# Patient Record
Sex: Female | Born: 1965
Health system: Southern US, Community
[De-identification: ages and names within clinical notes are randomized; demographics above are authoritative.]

## PROBLEM LIST (undated history)

## (undated) DIAGNOSIS — R202 Paresthesia of skin: Secondary | ICD-10-CM

## (undated) HISTORY — DX: Paresthesia of skin: R20.2

## (undated) HISTORY — PX: CARPAL TUNNEL WITH CUBITAL TUNNEL: SHX5608

---

## 1998-02-24 ENCOUNTER — Other Ambulatory Visit: Admission: RE | Admit: 1998-02-24 | Discharge: 1998-02-24 | Payer: Self-pay | Admitting: Internal Medicine

## 1999-09-15 ENCOUNTER — Inpatient Hospital Stay (HOSPITAL_COMMUNITY): Admission: AD | Admit: 1999-09-15 | Discharge: 1999-09-17 | Payer: Self-pay | Admitting: Obstetrics & Gynecology

## 1999-09-30 ENCOUNTER — Encounter: Admission: RE | Admit: 1999-09-30 | Discharge: 1999-12-29 | Payer: Self-pay | Admitting: Obstetrics & Gynecology

## 1999-10-28 ENCOUNTER — Other Ambulatory Visit: Admission: RE | Admit: 1999-10-28 | Discharge: 1999-10-28 | Payer: Self-pay | Admitting: Obstetrics & Gynecology

## 2000-12-08 ENCOUNTER — Other Ambulatory Visit: Admission: RE | Admit: 2000-12-08 | Discharge: 2000-12-08 | Payer: Self-pay | Admitting: Obstetrics & Gynecology

## 2001-04-06 ENCOUNTER — Encounter: Payer: Self-pay | Admitting: Obstetrics & Gynecology

## 2001-04-06 ENCOUNTER — Encounter: Admission: RE | Admit: 2001-04-06 | Discharge: 2001-04-06 | Payer: Self-pay | Admitting: Obstetrics & Gynecology

## 2002-01-18 ENCOUNTER — Other Ambulatory Visit: Admission: RE | Admit: 2002-01-18 | Discharge: 2002-01-18 | Payer: Self-pay | Admitting: Obstetrics & Gynecology

## 2002-05-08 ENCOUNTER — Other Ambulatory Visit: Admission: RE | Admit: 2002-05-08 | Discharge: 2002-05-08 | Payer: Self-pay | Admitting: Obstetrics & Gynecology

## 2003-02-27 ENCOUNTER — Other Ambulatory Visit: Admission: RE | Admit: 2003-02-27 | Discharge: 2003-02-27 | Payer: Self-pay | Admitting: Obstetrics & Gynecology

## 2003-05-08 ENCOUNTER — Encounter: Admission: RE | Admit: 2003-05-08 | Discharge: 2003-05-08 | Payer: Self-pay | Admitting: Obstetrics & Gynecology

## 2003-05-10 ENCOUNTER — Encounter: Admission: RE | Admit: 2003-05-10 | Discharge: 2003-05-10 | Payer: Self-pay | Admitting: Obstetrics & Gynecology

## 2004-07-13 ENCOUNTER — Encounter: Admission: RE | Admit: 2004-07-13 | Discharge: 2004-07-13 | Payer: Self-pay | Admitting: Obstetrics & Gynecology

## 2004-07-28 ENCOUNTER — Encounter: Admission: RE | Admit: 2004-07-28 | Discharge: 2004-07-28 | Payer: Self-pay | Admitting: Obstetrics & Gynecology

## 2004-09-29 ENCOUNTER — Ambulatory Visit: Payer: Self-pay | Admitting: Licensed Clinical Social Worker

## 2005-11-11 ENCOUNTER — Inpatient Hospital Stay (HOSPITAL_COMMUNITY): Admission: RE | Admit: 2005-11-11 | Discharge: 2005-11-13 | Payer: Self-pay | Admitting: Obstetrics & Gynecology

## 2006-07-25 ENCOUNTER — Encounter: Admission: RE | Admit: 2006-07-25 | Discharge: 2006-07-25 | Payer: Self-pay | Admitting: Obstetrics & Gynecology

## 2006-08-03 ENCOUNTER — Encounter: Admission: RE | Admit: 2006-08-03 | Discharge: 2006-08-03 | Payer: Self-pay | Admitting: Obstetrics & Gynecology

## 2007-11-18 ENCOUNTER — Emergency Department (HOSPITAL_COMMUNITY): Admission: EM | Admit: 2007-11-18 | Discharge: 2007-11-18 | Payer: Self-pay | Admitting: Emergency Medicine

## 2008-10-29 ENCOUNTER — Encounter: Admission: RE | Admit: 2008-10-29 | Discharge: 2008-10-29 | Payer: Self-pay | Admitting: Obstetrics & Gynecology

## 2009-04-25 ENCOUNTER — Encounter: Admission: RE | Admit: 2009-04-25 | Discharge: 2009-04-25 | Payer: Self-pay | Admitting: Gastroenterology

## 2009-05-30 ENCOUNTER — Ambulatory Visit (HOSPITAL_COMMUNITY): Admission: RE | Admit: 2009-05-30 | Discharge: 2009-05-30 | Payer: Self-pay | Admitting: Urology

## 2009-07-24 ENCOUNTER — Encounter (INDEPENDENT_AMBULATORY_CARE_PROVIDER_SITE_OTHER): Payer: Self-pay | Admitting: Urology

## 2009-07-24 ENCOUNTER — Inpatient Hospital Stay (HOSPITAL_COMMUNITY): Admission: RE | Admit: 2009-07-24 | Discharge: 2009-07-25 | Payer: Self-pay | Admitting: Urology

## 2009-10-21 ENCOUNTER — Ambulatory Visit (HOSPITAL_COMMUNITY): Admission: RE | Admit: 2009-10-21 | Discharge: 2009-10-21 | Payer: Self-pay | Admitting: Urology

## 2009-10-31 ENCOUNTER — Encounter: Admission: RE | Admit: 2009-10-31 | Discharge: 2009-10-31 | Payer: Self-pay | Admitting: Obstetrics & Gynecology

## 2009-11-20 ENCOUNTER — Ambulatory Visit (HOSPITAL_COMMUNITY): Admission: RE | Admit: 2009-11-20 | Discharge: 2009-11-20 | Payer: Self-pay | Admitting: Urology

## 2010-04-19 LAB — BASIC METABOLIC PANEL
BUN: 6 mg/dL (ref 6–23)
BUN: 8 mg/dL (ref 6–23)
CO2: 24 mEq/L (ref 19–32)
Calcium: 8.4 mg/dL (ref 8.4–10.5)
Chloride: 106 mEq/L (ref 96–112)
Creatinine, Ser: 0.73 mg/dL (ref 0.4–1.2)
GFR calc Af Amer: >60 mL/min (ref >60)
GFR calc Af Amer: >60 mL/min (ref >60)
GFR calc non Af Amer: >60 mL/min (ref >60)
Glucose, Bld: 125 mg/dL — ABNORMAL HIGH (ref 70–99)
Glucose, Bld: 149 mg/dL — ABNORMAL HIGH (ref 70–99)
Potassium: 3.4 mEq/L — ABNORMAL LOW (ref 3.5–5.1)
Potassium: 4 mEq/L (ref 3.5–5.1)
Sodium: 136 mEq/L (ref 135–145)
Sodium: 139 mEq/L (ref 135–145)

## 2010-04-19 LAB — TYPE AND SCREEN: ABO/RH(D): O NEG

## 2010-04-19 LAB — ABO/RH: ABO/RH(D): O NEG

## 2010-04-19 LAB — HEMOGLOBIN AND HEMATOCRIT, BLOOD
Hemoglobin: 11.8 g/dL — ABNORMAL LOW (ref 12.0–15.0)
Hemoglobin: 12.9 g/dL (ref 12.0–15.0)

## 2010-04-19 LAB — CREATININE, FLUID (PLEURAL, PERITONEAL, JP DRAINAGE)

## 2010-04-20 LAB — SURGICAL PCR SCREEN: MRSA, PCR: NEGATIVE

## 2010-04-20 LAB — CBC
MCHC: 35.2 g/dL (ref 30.0–36.0)
RDW: 11.7 % (ref 11.5–15.5)

## 2010-04-20 LAB — BASIC METABOLIC PANEL
BUN: 15 mg/dL (ref 6–23)
GFR calc Af Amer: >60 mL/min (ref >60)
Glucose, Bld: 86 mg/dL (ref 70–99)

## 2010-04-20 LAB — PREGNANCY, URINE: Preg Test, Ur: NEGATIVE

## 2010-06-19 NOTE — H&P (Signed)
[facility]  Patient:    MARQUASIA, SCHMIEDER                           MRN: [id number] Adm. Date:  09/15/99 Attending:  Genia Del, M.D.                         History and Physical  DATE OF BIRTH:                [birth year]  PATIENT IDENTIFICATION:       Mrs. Lunt is a 45 year old, G1, last menstrual period November 31, 2000, for expected date of delivery on September 08, 1999, at [redacted] weeks gestation.  REASON FOR ADMISSION:         Induction for post dates.  HISTORY OF PRESENT ILLNESS:   Good fetal movement, no vaginal bleeding, no fluid leak, no uterine contractions, no PIH symptoms.  The patient had an ultrasound on September 10, 1999, showing an estimated fetal weight at 3298 g for the 21st percentile and amniotic fluid index at 13.6 cm for the 64th percentile.  Placenta was posterior, normal, vertex presentation.  Biophysical profile was 8/8.  PAST MEDICAL HISTORY:         Dermatitis followed by dermatologist.  PAST SURGICAL HISTORY:        Negative.  MEDICATIONS:                  1. Clobetasol propionate ointment p.r.n.                               2. Fluocinonide topical cream p.r.n., not used                                  during pregnancy.                               3. Prenatal vitamins.  ALLERGIES:                    PENICILLIN and CODEINE.  SOCIAL HISTORY:               Nonsmoker, married.  Engineer, agricultural.  HISTORY OF PRESENT PREGNANCY: First trimester normal.  Hemoglobin 13.8, platelets 315, blood type Rh O negative, Rh antibodies negative, RPR nonreactive, rubella titer immune, HBsAg negative, HIV nonreactive.  Gonorrhea and chlamydia negative.  Pap test within normal limits January 2001. The patient declined amniocentesis and triple test in spite of advanced maternal age.  She had ultrasound review of anatomy at 21 weeks which was within normal limits except for low-lying placenta.  It was then followed by ultrasound  and, at 29 weeks and 2 days, placenta was inserted normally.  She had an estimated fetal weight at the 17th percentile at that time, so ultrasound for interval growth was done at 31 weeks and 2 days and showed good interval growth at the 41st percentile with normal amniotic fluid index.  Then, another ultrasound was done because the patient was reaching term at 40 weeks and 2 days.  See History of Present Illness.  In the third trimester, one-hour GTT was done at 28 weeks which was abnormal.  RhoGAM was done at that same visit.  Then three-hour GTT was done at 29+ weeks and was within normal limits.  Group B strep was done at 35 weeks and was negative.  REVIEW OF SYSTEMS:            Constitutional negative.  HEENT: Negative. Respiratory: Negative.  Cardiovascular: Negative.  GI: Negative.  Urologic: Negative.  Dermatologic: Occasional symptoms associated with dermatitis, rash, well controlled when using occasional low-dose corticosteroid creams. Neurologic: Negative.  PHYSICAL EXAMINATION:  GENERAL:                      No apparent distress.  VITAL SIGNS:                  Blood pressure 122/80, pulse regular, respiratory rate normal.  No fever.  LUNGS:                        Clear bilaterally.  HEART:                        S1, S2 normal.  No murmur.  ABDOMEN:                      Gravid with a uterine height of 38 cm, nontender, soft, vertex presentation.  PELVIC:                       On vaginal exam, cervix is 1 cm dilated, about 30 to 40% effaced, vertex -2.  EXTREMITIES:                  Lower limbs are normal with very mild edema, no clonus.  LABORATORY DATA:              Fetal heart tone was 152 per minute.  No uterine contraction palpated.  IMPRESSION:                   1. At [redacted] weeks gestation.                               2. Advanced maternal age.                               3. Declined amniocentesis and triple test.                               4. Group B  strep negative.  PLAN:                         Admit to [facility] Labor [facility] Delivery. Induction for post dates with rupture of membranes if possible, the Pitocin high-dose protocol.  Induction risks, benefits, and procedure discussed with patient and husband who agreed with decision. DD:  09/14/99 TD:  09/14/99 Job: [id number]

## 2010-07-13 ENCOUNTER — Other Ambulatory Visit (HOSPITAL_COMMUNITY): Payer: Self-pay | Admitting: Urology

## 2010-07-13 DIAGNOSIS — N135 Crossing vessel and stricture of ureter without hydronephrosis: Secondary | ICD-10-CM

## 2010-07-20 ENCOUNTER — Other Ambulatory Visit (HOSPITAL_COMMUNITY): Payer: Self-pay

## 2010-07-30 ENCOUNTER — Other Ambulatory Visit (HOSPITAL_COMMUNITY): Payer: Self-pay | Admitting: Urology

## 2010-07-30 DIAGNOSIS — N135 Crossing vessel and stricture of ureter without hydronephrosis: Secondary | ICD-10-CM

## 2010-08-19 ENCOUNTER — Encounter (HOSPITAL_COMMUNITY)
Admission: RE | Admit: 2010-08-19 | Discharge: 2010-08-19 | Disposition: A | Discharge status: Home / Self Care | Payer: BC Managed Care – PPO | Source: Ambulatory Visit | Attending: Urology | Admitting: Urology

## 2010-08-19 DIAGNOSIS — N135 Crossing vessel and stricture of ureter without hydronephrosis: Secondary | ICD-10-CM

## 2010-08-19 MED ORDER — TECHNETIUM TC 99M MERTIATIDE
15.0000 | Freq: Once | INTRAVENOUS | Status: AC | PRN
  Administered 2010-08-19: 15 via INTRAVENOUS

## 2010-08-19 MED ORDER — FUROSEMIDE 10 MG/ML IJ SOLN: 30.0000 mg | Freq: Once | INTRAMUSCULAR | Status: DC

## 2010-10-02 ENCOUNTER — Other Ambulatory Visit: Payer: Self-pay | Admitting: Obstetrics & Gynecology

## 2010-10-02 DIAGNOSIS — Z1231 Encounter for screening mammogram for malignant neoplasm of breast: Secondary | ICD-10-CM

## 2010-11-04 ENCOUNTER — Ambulatory Visit
Admission: RE | Admit: 2010-11-04 | Discharge: 2010-11-04 | Disposition: A | Discharge status: Home / Self Care | Payer: BC Managed Care – PPO | Source: Ambulatory Visit | Attending: Obstetrics & Gynecology | Admitting: Obstetrics & Gynecology

## 2010-11-04 DIAGNOSIS — Z1231 Encounter for screening mammogram for malignant neoplasm of breast: Secondary | ICD-10-CM

## 2010-11-09 ENCOUNTER — Other Ambulatory Visit: Payer: Self-pay | Admitting: Obstetrics & Gynecology

## 2010-11-09 DIAGNOSIS — R928 Other abnormal and inconclusive findings on diagnostic imaging of breast: Secondary | ICD-10-CM

## 2010-11-23 ENCOUNTER — Other Ambulatory Visit: Payer: BC Managed Care – PPO

## 2010-11-25 ENCOUNTER — Other Ambulatory Visit: Payer: BC Managed Care – PPO

## 2011-05-18 ENCOUNTER — Ambulatory Visit
Admission: RE | Admit: 2011-05-18 | Discharge: 2011-05-18 | Disposition: A | Discharge status: Home / Self Care | Payer: BC Managed Care – PPO | Source: Ambulatory Visit | Attending: Obstetrics & Gynecology | Admitting: Obstetrics & Gynecology

## 2011-05-18 DIAGNOSIS — R928 Other abnormal and inconclusive findings on diagnostic imaging of breast: Secondary | ICD-10-CM

## 2012-05-04 ENCOUNTER — Other Ambulatory Visit: Payer: Self-pay

## 2012-05-04 DIAGNOSIS — Z1231 Encounter for screening mammogram for malignant neoplasm of breast: Secondary | ICD-10-CM

## 2012-06-09 ENCOUNTER — Ambulatory Visit
Admission: RE | Admit: 2012-06-09 | Discharge: 2012-06-09 | Disposition: A | Discharge status: Home / Self Care | Payer: BC Managed Care – PPO | Source: Ambulatory Visit

## 2012-06-09 DIAGNOSIS — Z1231 Encounter for screening mammogram for malignant neoplasm of breast: Secondary | ICD-10-CM

## 2013-03-06 ENCOUNTER — Ambulatory Visit: Payer: Self-pay | Admitting: Podiatry

## 2013-05-08 ENCOUNTER — Other Ambulatory Visit: Payer: Self-pay

## 2013-05-08 DIAGNOSIS — Z1231 Encounter for screening mammogram for malignant neoplasm of breast: Secondary | ICD-10-CM

## 2013-06-12 ENCOUNTER — Ambulatory Visit: Payer: BC Managed Care – PPO

## 2013-06-22 ENCOUNTER — Ambulatory Visit
Admission: RE | Admit: 2013-06-22 | Discharge: 2013-06-22 | Disposition: A | Discharge status: Home / Self Care | Payer: BC Managed Care – PPO | Source: Ambulatory Visit

## 2013-06-22 DIAGNOSIS — Z1231 Encounter for screening mammogram for malignant neoplasm of breast: Secondary | ICD-10-CM

## 2014-06-19 ENCOUNTER — Other Ambulatory Visit: Payer: Self-pay

## 2014-06-19 DIAGNOSIS — Z1231 Encounter for screening mammogram for malignant neoplasm of breast: Secondary | ICD-10-CM

## 2014-07-16 ENCOUNTER — Ambulatory Visit
Admission: RE | Admit: 2014-07-16 | Discharge: 2014-07-16 | Disposition: A | Discharge status: Home / Self Care | Payer: PRIVATE HEALTH INSURANCE | Source: Ambulatory Visit

## 2014-07-16 DIAGNOSIS — Z1231 Encounter for screening mammogram for malignant neoplasm of breast: Secondary | ICD-10-CM

## 2016-09-21 DIAGNOSIS — M722 Plantar fascial fibromatosis: Secondary | ICD-10-CM | POA: Diagnosis not present

## 2016-09-21 DIAGNOSIS — R202 Paresthesia of skin: Secondary | ICD-10-CM | POA: Diagnosis not present

## 2016-09-30 ENCOUNTER — Ambulatory Visit: Payer: Self-pay | Admitting: Podiatry

## 2017-01-19 DIAGNOSIS — H6983 Other specified disorders of Eustachian tube, bilateral: Secondary | ICD-10-CM | POA: Diagnosis not present

## 2017-02-16 ENCOUNTER — Ambulatory Visit (INDEPENDENT_AMBULATORY_CARE_PROVIDER_SITE_OTHER): Payer: BLUE CROSS/BLUE SHIELD

## 2017-02-16 ENCOUNTER — Ambulatory Visit (INDEPENDENT_AMBULATORY_CARE_PROVIDER_SITE_OTHER): Payer: BLUE CROSS/BLUE SHIELD | Admitting: Orthopaedic Surgery

## 2017-02-16 ENCOUNTER — Encounter (INDEPENDENT_AMBULATORY_CARE_PROVIDER_SITE_OTHER): Payer: Self-pay | Admitting: Orthopaedic Surgery

## 2017-02-16 DIAGNOSIS — M25551 Pain in right hip: Secondary | ICD-10-CM

## 2017-02-16 DIAGNOSIS — M722 Plantar fascial fibromatosis: Secondary | ICD-10-CM | POA: Diagnosis not present

## 2017-02-16 DIAGNOSIS — M7061 Trochanteric bursitis, right hip: Secondary | ICD-10-CM | POA: Insufficient documentation

## 2017-02-16 NOTE — Progress Notes (Signed)
Office Visit Note   Patient: Jennifer Frye           Date of Birth: [birth year]           MRN: [id number] Visit Date: 02/16/2017              Requested by: No referring provider defined for this encounter. PCP: Iva Boop, MD   Assessment & Plan: Visit Diagnoses:  1. Pain in right hip   2. Trochanteric bursitis, right hip   3. Bilateral plantar fasciitis     Plan: We talked about the possibility of steroid injections and she is deferring this now with the option of trying outpatient physical therapy to work on any modalities to decrease her pain and improve her function.  Talked about trochanteric bursitis and plantar fasciitis in great length in detail.  All questions and concerns were answered and addressed.  I gave her an outpatient prescription for physical therapy with [facility] orthopedics is where she like to go to consider the laser treatment.  All questions and concerns were answered and addressed like I said before.  We will see her back in a month see how she is doing overall.  If she is not getting much relief by then she will consider steroid injections.  Follow-Up Instructions: Return in about 4 weeks (around 03/16/2017).   Orders:  Orders Placed This Encounter  Procedures  . XR HIP UNILAT W OR W/O PELVIS 1V RIGHT   No orders of the defined types were placed in this encounter.     Procedures: No procedures performed   Clinical Data: No additional findings.   Subjective: No chief complaint on file. The patient is an incredibly pleasant 52 year old female who comes in with bilateral heel pain for 6 months with the left worse than the right as well as right hip pain for 6 months she points to the trochanteric area as a source of her pain she denies any groin pain.  Both these areas are significant stiff when she gets up in the morning first.  She denies any numbness and tingling in her feet and denies any back issues.  She is tried night splints.  She is tried  other changes in shoe wear and nothing has helped.  The first step she takes in the morning is quite debilitating and painful.  She does work in [employer] and is on her feet all day long.  She denies any other active medical problems at all.  She is not a diabetic.  She is work on activity modification as well.  She does not sleep on that right side at night.  Stretching of her right hip causes significant pain.  HPI  Review of Systems She currently denies any headache, chest pain, shortness of breath, fever, chills, nausea, vomiting.  Objective: Vital Signs: There were no vitals taken for this visit.  Physical Exam She is alert and oriented x3 and in no acute distress Ortho Exam Examination of her right hip shows fluid range of motion of the right hip.  Her pain is over the trochanteric area only.  It does not radiate down the IT band but is definitely all around the stroke.  Both feet have normal-appearing arches.  The left hurts more over the heel than the right side but both of them signs and symptoms and clinical exam consistent with plantar fasciitis. Specialty Comments:  No specialty comments available.  Imaging: Xr Hip Unilat W Or W/o Pelvis 1v Right  Result  Date: 02/16/2017 An AP pelvis and lateral right hip show well located hip joints with no significant arthritic findings at all.  There is no cortical irregularities around either hip or trochanteric areas.  There are no acute findings otherwise.    PMFS History: Patient Active Problem List   Diagnosis Date Noted  . Trochanteric bursitis, right hip 02/16/2017  . Bilateral plantar fasciitis 02/16/2017   History reviewed. No pertinent past medical history.  History reviewed. No pertinent family history.  History reviewed. No pertinent surgical history. Social History   Occupational History  . Not on file  Tobacco Use  . Smoking status: Not on file  Substance and Sexual Activity  . Alcohol use: Not on file  . Drug  use: Not on file  . Sexual activity: Not on file

## 2017-02-21 ENCOUNTER — Other Ambulatory Visit: Payer: Self-pay | Admitting: Obstetrics & Gynecology

## 2017-02-21 DIAGNOSIS — Z1231 Encounter for screening mammogram for malignant neoplasm of breast: Secondary | ICD-10-CM

## 2017-02-28 DIAGNOSIS — M722 Plantar fascial fibromatosis: Secondary | ICD-10-CM | POA: Diagnosis not present

## 2017-02-28 DIAGNOSIS — M7061 Trochanteric bursitis, right hip: Secondary | ICD-10-CM | POA: Diagnosis not present

## 2017-03-01 ENCOUNTER — Telehealth (INDEPENDENT_AMBULATORY_CARE_PROVIDER_SITE_OTHER): Payer: Self-pay

## 2017-03-01 ENCOUNTER — Other Ambulatory Visit (INDEPENDENT_AMBULATORY_CARE_PROVIDER_SITE_OTHER): Payer: Self-pay

## 2017-03-01 MED ORDER — DEXTROSE 5 % IV SOLN: 0.4000 mg | INTRAVENOUS | 1 refills | Status: DC

## 2017-03-01 NOTE — Telephone Encounter (Signed)
That sounds fine, I just don't know how to do that

## 2017-03-01 NOTE — Telephone Encounter (Signed)
Called into pharmacy

## 2017-03-01 NOTE — Telephone Encounter (Signed)
Order written for iontophoresis for pt but need rx for dexamethasone called [facility] phar[facility] med compounded. Call with questions.

## 2017-03-01 NOTE — Telephone Encounter (Signed)
Jennifer Frye returning call. Advised that it would be dexamethasone 0.4% solution they normally dispense 30mL. Just FYI. Cb# [phone]

## 2017-03-01 NOTE — Telephone Encounter (Signed)
Please advise 

## 2017-03-02 DIAGNOSIS — M722 Plantar fascial fibromatosis: Secondary | ICD-10-CM | POA: Diagnosis not present

## 2017-03-04 DIAGNOSIS — M722 Plantar fascial fibromatosis: Secondary | ICD-10-CM | POA: Diagnosis not present

## 2017-03-09 DIAGNOSIS — M722 Plantar fascial fibromatosis: Secondary | ICD-10-CM | POA: Diagnosis not present

## 2017-03-10 ENCOUNTER — Ambulatory Visit: Payer: PRIVATE HEALTH INSURANCE

## 2017-03-11 DIAGNOSIS — M722 Plantar fascial fibromatosis: Secondary | ICD-10-CM | POA: Diagnosis not present

## 2017-03-14 DIAGNOSIS — M722 Plantar fascial fibromatosis: Secondary | ICD-10-CM | POA: Diagnosis not present

## 2017-03-16 DIAGNOSIS — M722 Plantar fascial fibromatosis: Secondary | ICD-10-CM | POA: Diagnosis not present

## 2017-03-22 ENCOUNTER — Ambulatory Visit
Admission: RE | Admit: 2017-03-22 | Discharge: 2017-03-22 | Disposition: A | Discharge status: Home / Self Care | Payer: BLUE CROSS/BLUE SHIELD | Source: Ambulatory Visit | Attending: Obstetrics & Gynecology | Admitting: Obstetrics & Gynecology

## 2017-03-22 DIAGNOSIS — M7061 Trochanteric bursitis, right hip: Secondary | ICD-10-CM | POA: Diagnosis not present

## 2017-03-22 DIAGNOSIS — M722 Plantar fascial fibromatosis: Secondary | ICD-10-CM | POA: Diagnosis not present

## 2017-03-22 DIAGNOSIS — Z1231 Encounter for screening mammogram for malignant neoplasm of breast: Secondary | ICD-10-CM | POA: Diagnosis not present

## 2017-03-24 DIAGNOSIS — M722 Plantar fascial fibromatosis: Secondary | ICD-10-CM | POA: Diagnosis not present

## 2017-03-29 DIAGNOSIS — M722 Plantar fascial fibromatosis: Secondary | ICD-10-CM | POA: Diagnosis not present

## 2017-03-30 ENCOUNTER — Ambulatory Visit (INDEPENDENT_AMBULATORY_CARE_PROVIDER_SITE_OTHER): Payer: BLUE CROSS/BLUE SHIELD | Admitting: Orthopaedic Surgery

## 2017-03-31 ENCOUNTER — Encounter: Payer: PRIVATE HEALTH INSURANCE | Admitting: Obstetrics & Gynecology

## 2017-04-20 ENCOUNTER — Ambulatory Visit (INDEPENDENT_AMBULATORY_CARE_PROVIDER_SITE_OTHER): Payer: BLUE CROSS/BLUE SHIELD | Admitting: Obstetrics & Gynecology

## 2017-04-20 ENCOUNTER — Encounter: Payer: Self-pay | Admitting: Obstetrics & Gynecology

## 2017-04-20 VITALS — BP 140/90 | Ht 66.0 in | Wt 158.0 lb

## 2017-04-20 DIAGNOSIS — R200 Anesthesia of skin: Secondary | ICD-10-CM

## 2017-04-20 DIAGNOSIS — Z01411 Encounter for gynecological examination (general) (routine) with abnormal findings: Secondary | ICD-10-CM | POA: Diagnosis not present

## 2017-04-20 DIAGNOSIS — R202 Paresthesia of skin: Secondary | ICD-10-CM | POA: Diagnosis not present

## 2017-04-20 DIAGNOSIS — N904 Leukoplakia of vulva: Secondary | ICD-10-CM

## 2017-04-20 DIAGNOSIS — Z780 Asymptomatic menopausal state: Secondary | ICD-10-CM | POA: Diagnosis not present

## 2017-04-20 MED ORDER — CLOBETASOL PROPIONATE 0.05 % EX OINT: 1.0000 "application " | TOPICAL_OINTMENT | CUTANEOUS | 5 refills | Status: DC

## 2017-04-20 NOTE — Progress Notes (Signed)
Maximino SarinLori J Banet 10-26-1965 [id number]   History:    52 y.o. G2P2L2 Married.  Daughter Duwayne HeckDanielle is now 54 yo.  RP:  Established patient presenting for annual gyn exam   HPI: Menopause, well on no HRT.  No PMB.  No pelvic pain.  No paint with IC, but not frequently sexually active.  C/O itching at anterior vulva which is worsening.  Urine/BMs wnl.  Breasts wnl.  BMI 25.50.  Persistent Plantar Fasciitis in spite of many different treatments attempted.  C/O numbness of forearms and tingling of both feet.    Past medical history,surgical history, family history and social history were all reviewed and documented in the EPIC chart.  Gynecologic History Patient's last menstrual period was 05/16/2012. Contraception: post menopausal status Last Pap: 03/2016. Results were: Negative.  HPV HR neg 12/2014. Last mammogram: 2019. Results were: Negative Bone Density: Never Colonoscopy: 2016.  5 yr schedule because of brother with Colon Ca  Obstetric History OB History  Gravida Para Term Preterm AB Living  2 2       2   SAB TAB Ectopic Multiple Live Births               # Outcome Date GA Lbr Len/2nd Weight Sex Delivery Anes PTL Lv  2 Para           1 Para                ROS: A ROS was performed and pertinent positives and negatives are included in the history.  GENERAL: No fevers or chills. HEENT: No change in vision, no earache, sore throat or sinus congestion. NECK: No pain or stiffness. CARDIOVASCULAR: No chest pain or pressure. No palpitations. PULMONARY: No shortness of breath, cough or wheeze. GASTROINTESTINAL: No abdominal pain, nausea, vomiting or diarrhea, melena or bright red blood per rectum. GENITOURINARY: No urinary frequency, urgency, hesitancy or dysuria. MUSCULOSKELETAL: No joint or muscle pain, no back pain, no recent trauma. DERMATOLOGIC: No rash, no itching, no lesions. ENDOCRINE: No polyuria, polydipsia, no heat or cold intolerance. No recent change in weight. HEMATOLOGICAL: No  anemia or easy bruising or bleeding. NEUROLOGIC: No headache, seizures, numbness, tingling or weakness. PSYCHIATRIC: No depression, no loss of interest in normal activity or change in sleep pattern.     Exam:   BP 140/90   Ht 5\' 6"  (1.676 m)   Wt 158 lb (71.7 kg)   LMP 05/16/2012   BMI 25.50 kg/m   Body mass index is 25.5 kg/m.  General appearance : Well developed well nourished female. No acute distress HEENT: Eyes: no retinal hemorrhage or exudates,  Neck supple, trachea midline, no carotid bruits, no thyroidmegaly Lungs: Clear to auscultation, no rhonchi or wheezes, or rib retractions  Heart: Regular rate and rhythm, no murmurs or gallops Breast:Examined in sitting and supine position were symmetrical in appearance, no palpable masses or tenderness,  no skin retraction, no nipple inversion, no nipple discharge, no skin discoloration, no axillary or supraclavicular lymphadenopathy Abdomen: no palpable masses or tenderness, no rebound or guarding Extremities: no edema or skin discoloration or tenderness  Pelvic: Vulva: White atrophy anterior vulva with small fissure on right side between labia minora and majora             Vagina: No gross lesions or discharge  Cervix: No gross lesions or discharge  Uterus  AV, normal size, shape and consistency, non-tender and mobile  Adnexa  Without masses or tenderness  Anus: Normal   Assessment/Plan:  52 y.o. female for annual exam   1. Encounter for gynecological examination with abnormal finding Gynecologic exam normal except for early Lichen sclerosus of the vulva.  Pap test negative February 2018 and HPV high risk negative November 2016.  Will repeat Pap test next year.  Breast exam normal.  Screening mammogram negative February 2019.  Colonoscopy 2016, will repeat in 2021.  Health labs with family physician.  2. Menopause present Well on no HRT.  No PMB.  Vitamin D supplements, calcium rich nutrition and regular weightbearing physical  activity recommended.  For now patient is limited by plantar fasciitis, but will increase weight lifting activities.  Will do bone density next year.  3. Lichen sclerosus et atrophicus of the vulva Diagnosis, treatment and risks reviewed with patient.  Will treat with Clobetasol 0.05% ointment, a thin layer on affected skin of vulva twice a day for 2 weeks, then twice a week for long term therapy.  Patient will f/u to reevaluate and do a vulvar biopsy if no improvement of symptoms.  Information added to summary.  4. Numbness and tingling of both feet No evidence of Sciatalgia, no oedema.  Referred to Neurology.  5. Numbness and tingling of both upper extremities Probably due to positional nerve compression, but given the multiple sites, decision to refer to Neurology.  Other orders - cholecalciferol (VITAMIN D) 1000 units tablet; Take 1,000 Units by mouth daily. - clobetasol ointment (TEMOVATE) 0.05 %; Apply 1 application topically 2 (two) times a week. Thin application on vulva twice a day for 2 weeks, then twice a week for long term use.  Counseling on above issues and coordination of care more than 50% of 10 minutes.  Genia Del MD, 4:45 PM 04/20/2017

## 2017-04-20 NOTE — Patient Instructions (Signed)
1. Encounter for gynecological examination with abnormal finding Gynecologic exam normal except for early Lichen sclerosus of the vulva.  Pap test negative February 2018 and HPV high risk negative November 2016.  Will repeat Pap test next year.  Breast exam normal.  Screening mammogram negative February 2019.  Colonoscopy 2016, will repeat in 2021.  Health labs with family physician.  2. Menopause present Well on no HRT.  No PMB.  Vitamin D supplements, calcium rich nutrition and regular weightbearing physical activity recommended.  For now patient is limited by plantar fasciitis, but will increase weight lifting activities.  Will do bone density next year.  3. Lichen sclerosus et atrophicus of the vulva Diagnosis, treatment and risks reviewed with patient.  Will treat with Clobetasol 0.05% ointment, a thin layer on affected skin of vulva twice a day for 2 weeks, then twice a week for long term therapy.  Patient will f/u to reevaluate and do a vulvar biopsy if no improvement of symptoms.  Information added to summary.  4. Numbness and tingling of both feet No evidence of Sciatalgia, no oedema.  Referred to Neurology.  5. Numbness and tingling of both upper extremities Probably due to positional nerve compression, but given the multiple sites, decision to refer to Neurology.  Other orders - cholecalciferol (VITAMIN D) 1000 units tablet; Take 1,000 Units by mouth daily. - clobetasol ointment (TEMOVATE) 0.05 %; Apply 1 application topically 2 (two) times a week. Thin application on vulva twice a day for 2 weeks, then twice a week for long term use.  Kem, it was a pleasure seeing you today!   Lichen Sclerosus Lichen sclerosus is a skin problem. It can happen on any part of the body, but it commonly involves the anal or genital areas. It can cause itching and discomfort in these areas. Treatment can help to control symptoms. When the genital area is affected, getting treatment is important because  the condition can cause scarring that may lead to other problems. What are the causes? The cause of this condition is not known. It could be the result of an overactive immune system or a lack of certain hormones. Lichen sclerosus is not an infection or a fungus. It is not passed from one person to another (not contagious). What increases the risk? This condition is more likely to develop in women, usually after menopause. What are the signs or symptoms? Symptoms of this condition include:  Thin, wrinkled, white areas on the skin.  Thickened white areas on the skin.  Red and swollen patches (lesions) on the skin.  Tears or cracks in the skin.  Bruising.  Blood blisters.  Severe itching.  You may also have pain, itching, or burning with urination. Constipation is also common in people with lichen sclerosus. How is this diagnosed? This condition may be diagnosed with a physical exam. In some cases, a tissue sample (biopsy sample) may be removed to be looked at under a microscope. How is this treated? This condition is usually treated with medicated creams or ointments (topical steroids) that are applied over the affected areas. Follow these instructions at home:  Take over-the-counter and prescription medicines only as told by your health care provider.  Use creams or ointments as told by your health care provider.  Do not scratch the affected areas of skin.  Women should keep the vaginal area as clean and dry as possible.  Keep all follow-up visits as told by your health care provider. This is important. Contact a health  care provider if:  You have increasing redness, swelling, or pain in the affected area.  You have fluid, blood, or pus coming from the affected area.  You have new lesions on your skin.  You have a fever.  You have pain during sex. This information is not intended to replace advice given to you by your health care provider. Make sure you discuss any  questions you have with your health care provider. Document Released: 06/10/2010 Document Revised: 06/26/2015 Document Reviewed: 04/15/2014 Elsevier Interactive Patient Education  2018 [place] Maintenance for Postmenopausal Women Menopause is a normal process in which your reproductive ability comes to an end. This process happens gradually over a span of months to years, usually between the ages of 75 and 75. Menopause is complete when you have missed 12 consecutive menstrual periods. It is important to talk with your health care provider about some of the most common conditions that affect postmenopausal women, such as heart disease, cancer, and bone loss (osteoporosis). Adopting a healthy lifestyle and getting preventive care can help to promote your health and wellness. Those actions can also lower your chances of developing some of these common conditions. What should I know about menopause? During menopause, you may experience a number of symptoms, such as:  Moderate-to-severe hot flashes.  Night sweats.  Decrease in sex drive.  Mood swings.  Headaches.  Tiredness.  Irritability.  Memory problems.  Insomnia.  Choosing to treat or not to treat menopausal changes is an individual decision that you make with your health care provider. What should I know about hormone replacement therapy and supplements? Hormone therapy products are effective for treating symptoms that are associated with menopause, such as hot flashes and night sweats. Hormone replacement carries certain risks, especially as you become older. If you are thinking about using estrogen or estrogen with progestin treatments, discuss the benefits and risks with your health care provider. What should I know about heart disease and stroke? Heart disease, heart attack, and stroke become more likely as you age. This may be due, in part, to the hormonal changes that your body experiences during menopause.  These can affect how your body processes dietary fats, triglycerides, and cholesterol. Heart attack and stroke are both medical emergencies. There are many things that you can do to help prevent heart disease and stroke:  Have your blood pressure checked at least every 1-2 years. High blood pressure causes heart disease and increases the risk of stroke.  If you are 48-44 years old, ask your health care provider if you should take aspirin to prevent a heart attack or a stroke.  Do not use any tobacco products, including cigarettes, chewing tobacco, or electronic cigarettes. If you need help quitting, ask your health care provider.  It is important to eat a healthy diet and maintain a healthy weight. ? Be sure to include plenty of vegetables, fruits, low-fat dairy products, and lean protein. ? Avoid eating foods that are high in solid fats, added sugars, or salt (sodium).  Get regular exercise. This is one of the most important things that you can do for your health. ? Try to exercise for at least 150 minutes each week. The type of exercise that you do should increase your heart rate and make you sweat. This is known as moderate-intensity exercise. ? Try to do strengthening exercises at least twice each week. Do these in addition to the moderate-intensity exercise.  Know your numbers.Ask your health care provider to check  your cholesterol and your blood glucose. Continue to have your blood tested as directed by your health care provider.  What should I know about cancer screening? There are several types of cancer. Take the following steps to reduce your risk and to catch any cancer development as early as possible. Breast Cancer  Practice breast self-awareness. ? This means understanding how your breasts normally appear and feel. ? It also means doing regular breast self-exams. Let your health care provider know about any changes, no matter how small.  If you are 75 or older, have a  clinician do a breast exam (clinical breast exam or CBE) every year. Depending on your age, family history, and medical history, it may be recommended that you also have a yearly breast X-ray (mammogram).  If you have a family history of breast cancer, talk with your health care provider about genetic screening.  If you are at high risk for breast cancer, talk with your health care provider about having an MRI and a mammogram every year.  Breast cancer (BRCA) gene test is recommended for women who have family members with BRCA-related cancers. Results of the assessment will determine the need for genetic counseling and BRCA1 and for BRCA2 testing. BRCA-related cancers include these types: ? Breast. This occurs in males or females. ? Ovarian. ? Tubal. This may also be called fallopian tube cancer. ? Cancer of the abdominal or pelvic lining (peritoneal cancer). ? Prostate. ? Pancreatic.  Cervical, Uterine, and Ovarian Cancer Your health care provider may recommend that you be screened regularly for cancer of the pelvic organs. These include your ovaries, uterus, and vagina. This screening involves a pelvic exam, which includes checking for microscopic changes to the surface of your cervix (Pap test).  For women ages 21-65, health care providers may recommend a pelvic exam and a Pap test every three years. For women ages 39-65, they may recommend the Pap test and pelvic exam, combined with testing for human papilloma virus (HPV), every five years. Some types of HPV increase your risk of cervical cancer. Testing for HPV may also be done on women of any age who have unclear Pap test results.  Other health care providers may not recommend any screening for nonpregnant women who are considered low risk for pelvic cancer and have no symptoms. Ask your health care provider if a screening pelvic exam is right for you.  If you have had past treatment for cervical cancer or a condition that could lead to  cancer, you need Pap tests and screening for cancer for at least 20 years after your treatment. If Pap tests have been discontinued for you, your risk factors (such as having a new sexual partner) need to be reassessed to determine if you should start having screenings again. Some women have medical problems that increase the chance of getting cervical cancer. In these cases, your health care provider may recommend that you have screening and Pap tests more often.  If you have a family history of uterine cancer or ovarian cancer, talk with your health care provider about genetic screening.  If you have vaginal bleeding after reaching menopause, tell your health care provider.  There are currently no reliable tests available to screen for ovarian cancer.  Lung Cancer Lung cancer screening is recommended for adults 24-18 years old who are at high risk for lung cancer because of a history of smoking. A yearly low-dose CT scan of the lungs is recommended if you:  Currently smoke.  Have a history of at least 30 pack-years of smoking and you currently smoke or have quit within the past 15 years. A pack-year is smoking an average of one pack of cigarettes per day for one year.  Yearly screening should:  Continue until it has been 15 years since you quit.  Stop if you develop a health problem that would prevent you from having lung cancer treatment.  Colorectal Cancer  This type of cancer can be detected and can often be prevented.  Routine colorectal cancer screening usually begins at age 60 and continues through age 15.  If you have risk factors for colon cancer, your health care provider may recommend that you be screened at an earlier age.  If you have a family history of colorectal cancer, talk with your health care provider about genetic screening.  Your health care provider may also recommend using home test kits to check for hidden blood in your stool.  A small camera at the end of a  tube can be used to examine your colon directly (sigmoidoscopy or colonoscopy). This is done to check for the earliest forms of colorectal cancer.  Direct examination of the colon should be repeated every 5-10 years until age 2. However, if early forms of precancerous polyps or small growths are found or if you have a family history or genetic risk for colorectal cancer, you may need to be screened more often.  Skin Cancer  Check your skin from head to toe regularly.  Monitor any moles. Be sure to tell your health care provider: ? About any new moles or changes in moles, especially if there is a change in a mole's shape or color. ? If you have a mole that is larger than the size of a pencil eraser.  If any of your family members has a history of skin cancer, especially at a young age, talk with your health care provider about genetic screening.  Always use sunscreen. Apply sunscreen liberally and repeatedly throughout the day.  Whenever you are outside, protect yourself by wearing long sleeves, pants, a wide-brimmed hat, and sunglasses.  What should I know about osteoporosis? Osteoporosis is a condition in which bone destruction happens more quickly than new bone creation. After menopause, you may be at an increased risk for osteoporosis. To help prevent osteoporosis or the bone fractures that can happen because of osteoporosis, the following is recommended:  If you are 80-94 years old, get at least 1,000 mg of calcium and at least 600 mg of vitamin D per day.  If you are older than age 47 but younger than age 85, get at least 1,200 mg of calcium and at least 600 mg of vitamin D per day.  If you are older than age 31, get at least 1,200 mg of calcium and at least 800 mg of vitamin D per day.  Smoking and excessive alcohol intake increase the risk of osteoporosis. Eat foods that are rich in calcium and vitamin D, and do weight-bearing exercises several times each week as directed by your  health care provider. What should I know about how menopause affects my mental health? Depression may occur at any age, but it is more common as you become older. Common symptoms of depression include:  Low or sad mood.  Changes in sleep patterns.  Changes in appetite or eating patterns.  Feeling an overall lack of motivation or enjoyment of activities that you previously enjoyed.  Frequent crying spells.  Talk with your  health care provider if you think that you are experiencing depression. What should I know about immunizations? It is important that you get and maintain your immunizations. These include:  Tetanus, diphtheria, and pertussis (Tdap) booster vaccine.  Influenza every year before the flu season begins.  Pneumonia vaccine.  Shingles vaccine.  Your health care provider may also recommend other immunizations. This information is not intended to replace advice given to you by your health care provider. Make sure you discuss any questions you have with your health care provider. Document Released: 03/12/2005 Document Revised: 08/08/2015 Document Reviewed: 10/22/2014 Elsevier Interactive Patient Education  2018 [employer].

## 2017-04-22 ENCOUNTER — Telehealth: Payer: Self-pay | Admitting: *Deleted

## 2017-04-22 DIAGNOSIS — R200 Anesthesia of skin: Secondary | ICD-10-CM

## 2017-04-22 DIAGNOSIS — R202 Paresthesia of skin: Principal | ICD-10-CM

## 2017-04-22 NOTE — Telephone Encounter (Signed)
-----   Message from Genia DelMarie-Lyne Lavoie, MD sent at 04/20/2017  5:09 PM EDT ----- Regarding: Refer to Neurology Numbness of bilateral forearms and tingling of bilateral feet.

## 2017-04-22 NOTE — Telephone Encounter (Signed)
[facility] will [facility] to [facility]. Pt aware of this as well.

## 2017-05-03 NOTE — Telephone Encounter (Signed)
[facility] called and left message for pt to call 

## 2017-05-04 NOTE — Telephone Encounter (Signed)
Pt scheduled on 05/09/17 @ 7:30am with Dr.Yan

## 2017-05-09 ENCOUNTER — Encounter: Payer: Self-pay | Admitting: Neurology

## 2017-05-09 ENCOUNTER — Ambulatory Visit (INDEPENDENT_AMBULATORY_CARE_PROVIDER_SITE_OTHER): Payer: BLUE CROSS/BLUE SHIELD | Admitting: Neurology

## 2017-05-09 VITALS — BP 120/69 | HR 72 | Ht 66.0 in | Wt 152.0 lb

## 2017-05-09 DIAGNOSIS — R202 Paresthesia of skin: Secondary | ICD-10-CM | POA: Diagnosis not present

## 2017-05-09 NOTE — Progress Notes (Signed)
PATIENT: Jennifer SarinLori J Frye DOB: [birth year]  Chief Complaint  Patient presents with  . Tingling    Reports tingling in forearms and feet.  Symptoms present since the end of July 2018.  She has only tried ibuprofen in the past without any relief.  She has had extensive treatment for bilateral plantar fasciitis.  Rozell Searing. OB-GYN    Lavoie, Marie-Lyne, MD - referring provider  . PCP    Via, Caryn BeeKevin, MD     HISTORICAL   Jennifer Frye is a 52 years old female, seen in refer by her primary care physician Dr. Genia DelLavoie, Marie-Lyne, MD, Via, Caryn BeeKevin, for evaluation of paresthesia, initial evaluation was on May 09, 2017.  She was previously healthy, in July 2018, she worked as a TEFL teacherevent planner, walked long distance, typing long period of time, afterwards, she noticed bilateral fifth finger paresthesia, elbow sensitivity, and also developed plantar fasciitis, eventually had mild improvement with physical therapy, but at the same time, she noticed bottom of her feet numbness tingling, which has been persistent since then.  She still has plantar fasciitis pain when bearing weight, otherwise she denies gait abnormality, no low back pain, no neck pain, no bowel bladder incontinence.  REVIEW OF SYSTEMS: Full 14 system review of systems performed and notable only for as above  ALLERGIES: Allergies  Allergen Reactions  . Codeine Hives  . Penicillins Hives    HOME MEDICATIONS: Current Outpatient Medications  Medication Sig Dispense Refill  . cholecalciferol (VITAMIN D) 1000 units tablet Take 1,000 Units by mouth daily.    . clobetasol ointment (TEMOVATE) 0.05 % Apply 1 application topically 2 (two) times a week. Thin application on vulva twice a day for 2 weeks, then twice a week for long term use. 30 g 5   No current facility-administered medications for this visit.     PAST MEDICAL HISTORY: Past Medical History:  Diagnosis Date  . Tingling     PAST SURGICAL HISTORY: History reviewed. No pertinent  surgical history.  FAMILY HISTORY: Family History  Problem Relation Age of Onset  . Hypertension Father   . Colon cancer Father   . Prostate cancer Father   . Dementia Father   . Hypertension Sister   . Healthy Mother   . Breast cancer Neg Hx     SOCIAL HISTORY:  Social History   Socioeconomic History  . Marital status: Married    Spouse name: Not on file  . Number of children: 2  . Years of education: 16  . Highest education level: Bachelor's degree (e.g., BA, AB, BS)  Occupational History  . Occupation: event planner  Social Needs  . Financial resource strain: Not on file  . Food insecurity:    Worry: Not on file    Inability: Not on file  . Transportation needs:    Medical: Not on file    Non-medical: Not on file  Tobacco Use  . Smoking status: Never Smoker  . Smokeless tobacco: Never Used  Substance and Sexual Activity  . Alcohol use: Yes    Comment: occ  . Drug use: Never  . Sexual activity: Yes    Partners: Male    Comment: 1st intercourse- 23, partners- 1, married- 20 yrs   Lifestyle  . Physical activity:    Days per week: Not on file    Minutes per session: Not on file  . Stress: Not on file  Relationships  . Social connections:    Talks on phone: Not on file  Gets together: Not on file    Attends religious service: Not on file    Active member of club or organization: Not on file    Attends meetings of clubs or organizations: Not on file    Relationship status: Not on file  . Intimate partner violence:    Fear of current or ex partner: Not on file    Emotionally abused: Not on file    Physically abused: Not on file    Forced sexual activity: Not on file  Other Topics Concern  . Not on file  Social History Narrative   Lives at home with family.   Right-handed.   Caffeine use: 2 cups per day (tea only)     PHYSICAL EXAM   Vitals:   05/09/17 0727  BP: 120/69  Pulse: 72  Weight: 152 lb (68.9 kg)  Height: 5\' 6"  (1.676 m)    Not  recorded      Body mass index is 24.53 kg/m.  PHYSICAL EXAMNIATION:  Gen: NAD, conversant, well nourised, obese, well groomed                     Cardiovascular: Regular rate rhythm, no peripheral edema, warm, nontender. Eyes: Conjunctivae clear without exudates or hemorrhage Neck: Supple, no carotid bruits. Pulmonary: Clear to auscultation bilaterally   NEUROLOGICAL EXAM:  MENTAL STATUS: Speech:    Speech is normal; fluent and spontaneous with normal comprehension.  Cognition:     Orientation to time, place and person     Normal recent and remote memory     Normal Attention span and concentration     Normal Language, naming, repeating,spontaneous speech     Fund of knowledge   CRANIAL NERVES: CN II: Visual fields are full to confrontation. Fundoscopic exam is normal with sharp discs and no vascular changes. Pupils are round equal and briskly reactive to light. CN III, IV, VI: extraocular movement are normal. No ptosis. CN V: Facial sensation is intact to pinprick in all 3 divisions bilaterally. Corneal responses are intact.  CN VII: Face is symmetric with normal eye closure and smile. CN VIII: Hearing is normal to rubbing fingers CN IX, X: Palate elevates symmetrically. Phonation is normal. CN XI: Head turning and shoulder shrug are intact CN XII: Tongue is midline with normal movements and no atrophy.  MOTOR: There is no pronator drift of out-stretched arms. Muscle bulk and tone are normal. Muscle strength is normal.  REFLEXES: Reflexes are 2+ and symmetric at the biceps, triceps, knees, and ankles. Plantar responses are flexor.  SENSORY: Intact to light touch, pinprick, positional sensation and vibratory sensation are intact in fingers and toes.  COORDINATION: Rapid alternating movements and fine finger movements are intact. There is no dysmetria on finger-to-nose and heel-knee-shin.    GAIT/STANCE: Posture is normal. Gait is steady with normal steps, base, arm  swing, and turning.  Mildly antalgic with heel walking  Romberg is absent.   DIAGNOSTIC DATA (LABS, IMAGING, TESTING) - I reviewed patient records, labs, notes, testing and imaging myself where available.   ASSESSMENT AND PLAN  Jennifer Frye is a 52 y.o. female   Bilateral feet paresthesia, fifth finger paresthesia  Most suggestive of peripheral neuropathy, bilateral ulnar neuropathy, she does has bilateral elbow sensitivity left worse than right  Laboratory evaluation for potential etiology  Have suggested her as needed NSAIDs, try to avoid over flexion of bilateral elbow and resting elbow on hard surface   Levert Feinstein, M.D. Ph.D.  [facility] [address] [place], [place] [address] Ph: [phone] Fax: [phone]  CC: Genia Del, MD, Via, Caryn Bee, MD

## 2017-05-10 ENCOUNTER — Other Ambulatory Visit (INDEPENDENT_AMBULATORY_CARE_PROVIDER_SITE_OTHER): Payer: Self-pay

## 2017-05-10 DIAGNOSIS — R202 Paresthesia of skin: Secondary | ICD-10-CM

## 2017-05-10 DIAGNOSIS — Z0289 Encounter for other administrative examinations: Secondary | ICD-10-CM

## 2017-05-12 ENCOUNTER — Telehealth: Payer: Self-pay | Admitting: Neurology

## 2017-05-12 LAB — PROTEIN ELECTROPHORESIS
A/G RATIO SPE: 1.4 (ref 0.7–1.7)
ALBUMIN ELP: 4 g/dL (ref 2.9–4.4)
Alpha 1: 0.2 g/dL (ref 0.0–0.4)
Alpha 2: 0.6 g/dL (ref 0.4–1.0)
Beta: 1.1 g/dL (ref 0.7–1.3)
GLOBULIN, TOTAL: 2.9 g/dL (ref 2.2–3.9)
Gamma Globulin: 0.9 g/dL (ref 0.4–1.8)

## 2017-05-12 LAB — COMPREHENSIVE METABOLIC PANEL
ALBUMIN: 4.8 g/dL (ref 3.5–5.5)
ALK PHOS: 103 IU/L (ref 39–117)
ALT: 16 IU/L (ref 0–32)
AST: 20 IU/L (ref 0–40)
Albumin/Globulin Ratio: 2.3 — ABNORMAL HIGH (ref 1.2–2.2)
BILIRUBIN TOTAL: 1.3 mg/dL — AB (ref 0.0–1.2)
BUN / CREAT RATIO: 20 (ref 9–23)
BUN: 18 mg/dL (ref 6–24)
CHLORIDE: 101 mmol/L (ref 96–106)
CO2: 26 mmol/L (ref 20–29)
Calcium: 9.8 mg/dL (ref 8.7–10.2)
Creatinine, Ser: 0.88 mg/dL (ref 0.57–1.00)
GFR calc Af Amer: 86 mL/min/{1.73_m2} (ref >59)
GFR calc non Af Amer: 75 mL/min/{1.73_m2} (ref >59)
GLOBULIN, TOTAL: 2.1 g/dL (ref 1.5–4.5)
GLUCOSE: 92 mg/dL (ref 65–99)
Potassium: 4.5 mmol/L (ref 3.5–5.2)
SODIUM: 142 mmol/L (ref 134–144)
Total Protein: 6.9 g/dL (ref 6.0–8.5)

## 2017-05-12 LAB — LIPID PANEL
CHOL/HDL RATIO: 3.8 ratio (ref 0.0–4.4)
Cholesterol, Total: 240 mg/dL — ABNORMAL HIGH (ref 100–199)
HDL: 63 mg/dL (ref >39)
LDL Calculated: 160 mg/dL — ABNORMAL HIGH (ref 0–99)
TRIGLYCERIDES: 86 mg/dL (ref 0–149)
VLDL Cholesterol Cal: 17 mg/dL (ref 5–40)

## 2017-05-12 LAB — CBC WITH DIFFERENTIAL/PLATELET
BASOS ABS: 0 10*3/uL (ref 0.0–0.2)
Basos: 0 %
EOS (ABSOLUTE): 0.3 10*3/uL (ref 0.0–0.4)
Eos: 5 %
Hematocrit: 46.2 % (ref 34.0–46.6)
Hemoglobin: 15.4 g/dL (ref 11.1–15.9)
Immature Grans (Abs): 0 10*3/uL (ref 0.0–0.1)
Immature Granulocytes: 0 %
LYMPHS ABS: 1.3 10*3/uL (ref 0.7–3.1)
Lymphs: 23 %
MCH: 29.7 pg (ref 26.6–33.0)
MCHC: 33.3 g/dL (ref 31.5–35.7)
MCV: 89 fL (ref 79–97)
MONOS ABS: 0.4 10*3/uL (ref 0.1–0.9)
Monocytes: 7 %
Neutrophils Absolute: 3.7 10*3/uL (ref 1.4–7.0)
Neutrophils: 65 %
Platelets: 246 10*3/uL (ref 150–379)
RBC: 5.18 x10E6/uL (ref 3.77–5.28)
RDW: 13.3 % (ref 12.3–15.4)
WBC: 5.6 10*3/uL (ref 3.4–10.8)

## 2017-05-12 LAB — C-REACTIVE PROTEIN: CRP: 1.7 mg/L (ref 0.0–4.9)

## 2017-05-12 LAB — HGB A1C W/O EAG: HEMOGLOBIN A1C: 5 % (ref 4.8–5.6)

## 2017-05-12 LAB — RPR: RPR Ser Ql: NONREACTIVE

## 2017-05-12 LAB — VITAMIN B12: Vitamin B-12: 367 pg/mL (ref 232–1245)

## 2017-05-12 LAB — SEDIMENTATION RATE: Sed Rate: 2 mm/hr (ref 0–40)

## 2017-05-12 LAB — TSH: TSH: 1.85 u[IU]/mL (ref 0.450–4.500)

## 2017-05-12 NOTE — Telephone Encounter (Signed)
Please call patient, extensive laboratory evaluation showed elevated LDL 160, cholesterol 240, she should start with diet control, exercise, follow-up with her primary care physician,  Rest of the laboratory evaluation showed no significant abnormality.

## 2017-05-12 NOTE — Telephone Encounter (Signed)
Spoke to patient - she is aware of results.  She would like a copy of her office visit and lab results.  This information has been mailed to her home address.

## 2017-06-07 DIAGNOSIS — G5620 Lesion of ulnar nerve, unspecified upper limb: Secondary | ICD-10-CM | POA: Diagnosis not present

## 2017-06-07 DIAGNOSIS — M79673 Pain in unspecified foot: Secondary | ICD-10-CM | POA: Diagnosis not present

## 2017-09-15 ENCOUNTER — Encounter: Payer: Self-pay | Admitting: *Deleted

## 2017-10-20 ENCOUNTER — Encounter: Payer: Self-pay | Admitting: *Deleted

## 2017-10-20 ENCOUNTER — Encounter: Payer: Self-pay | Admitting: Obstetrics & Gynecology

## 2017-10-20 NOTE — Progress Notes (Signed)
Pt requested the High Risk Medicare questions be declined and then asked again each year instead of being documented. KW CMA

## 2018-01-26 DIAGNOSIS — J209 Acute bronchitis, unspecified: Secondary | ICD-10-CM | POA: Diagnosis not present

## 2018-01-26 DIAGNOSIS — J069 Acute upper respiratory infection, unspecified: Secondary | ICD-10-CM | POA: Diagnosis not present

## 2018-03-02 ENCOUNTER — Other Ambulatory Visit: Payer: Self-pay | Admitting: Obstetrics & Gynecology

## 2018-03-02 DIAGNOSIS — Z1231 Encounter for screening mammogram for malignant neoplasm of breast: Secondary | ICD-10-CM

## 2018-03-21 DIAGNOSIS — J069 Acute upper respiratory infection, unspecified: Secondary | ICD-10-CM | POA: Diagnosis not present

## 2018-04-05 ENCOUNTER — Ambulatory Visit
Admission: RE | Admit: 2018-04-05 | Discharge: 2018-04-05 | Disposition: A | Discharge status: Home / Self Care | Payer: BLUE CROSS/BLUE SHIELD | Source: Ambulatory Visit | Attending: Obstetrics & Gynecology | Admitting: Obstetrics & Gynecology

## 2018-04-05 DIAGNOSIS — Z1231 Encounter for screening mammogram for malignant neoplasm of breast: Secondary | ICD-10-CM | POA: Diagnosis not present

## 2018-05-02 ENCOUNTER — Other Ambulatory Visit: Payer: Self-pay

## 2018-05-04 ENCOUNTER — Encounter: Payer: Self-pay | Admitting: Obstetrics & Gynecology

## 2018-05-04 ENCOUNTER — Other Ambulatory Visit: Payer: Self-pay

## 2018-05-04 ENCOUNTER — Ambulatory Visit (INDEPENDENT_AMBULATORY_CARE_PROVIDER_SITE_OTHER): Payer: BLUE CROSS/BLUE SHIELD | Admitting: Obstetrics & Gynecology

## 2018-05-04 VITALS — BP 120/70 | Ht 66.5 in | Wt 148.0 lb

## 2018-05-04 DIAGNOSIS — Z01419 Encounter for gynecological examination (general) (routine) without abnormal findings: Secondary | ICD-10-CM | POA: Diagnosis not present

## 2018-05-04 DIAGNOSIS — N904 Leukoplakia of vulva: Secondary | ICD-10-CM | POA: Diagnosis not present

## 2018-05-04 DIAGNOSIS — Z780 Asymptomatic menopausal state: Secondary | ICD-10-CM

## 2018-05-04 MED ORDER — CLOBETASOL PROPIONATE 0.05 % EX OINT: 1.0000 "application " | TOPICAL_OINTMENT | CUTANEOUS | 5 refills | Status: DC

## 2018-05-04 NOTE — Progress Notes (Signed)
KHILYN ZAJICEK 1965/12/06 [id number]   History:    53 y.o. G2P2L2 Married.  Daughter Duwayne Heck is 62 yo.  Son doing well.  RP:  Established patient presenting for annual gyn exam   HPI: Menopause, well without hormone replacement therapy.  No postmenopausal bleeding.  No pelvic pain.  No pain with intercourse.  Urine and bowel movements normal.  Breast normal.  Body mass index good at 23.53.  Good fitness and nutrition.  Health labs with family physician.  Past medical history,surgical history, family history and social history were all reviewed and documented in the EPIC chart.  Gynecologic History Patient's last menstrual period was 05/16/2012. Contraception: post menopausal status Last Pap: 03/2016. Results were: Negative Last mammogram: 04/2018. Results were: Negative Bone Density: Never Colonoscopy: 2016.  Will call Gastro to schedule next Colono.  Father with Colon Ca.  Obstetric History OB History  Gravida Para Term Preterm AB Living  2 2       2   SAB TAB Ectopic Multiple Live Births               # Outcome Date GA Lbr Len/2nd Weight Sex Delivery Anes PTL Lv  2 Para           1 Para              ROS: A ROS was performed and pertinent positives and negatives are included in the history.  GENERAL: No fevers or chills. HEENT: No change in vision, no earache, sore throat or sinus congestion. NECK: No pain or stiffness. CARDIOVASCULAR: No chest pain or pressure. No palpitations. PULMONARY: No shortness of breath, cough or wheeze. GASTROINTESTINAL: No abdominal pain, nausea, vomiting or diarrhea, melena or bright red blood per rectum. GENITOURINARY: No urinary frequency, urgency, hesitancy or dysuria. MUSCULOSKELETAL: No joint or muscle pain, no back pain, no recent trauma. DERMATOLOGIC: No rash, no itching, no lesions. ENDOCRINE: No polyuria, polydipsia, no heat or cold intolerance. No recent change in weight. HEMATOLOGICAL: No anemia or easy bruising or bleeding. NEUROLOGIC: No  headache, seizures, numbness, tingling or weakness. PSYCHIATRIC: No depression, no loss of interest in normal activity or change in sleep pattern.     Exam:   BP 120/70   Ht 5' 6.5" (1.689 m)   Wt 148 lb (67.1 kg)   LMP 05/16/2012   BMI 23.53 kg/m   Body mass index is 23.53 kg/m.  General appearance : Well developed well nourished female. No acute distress HEENT: Eyes: no retinal hemorrhage or exudates,  Neck supple, trachea midline, no carotid bruits, no thyroidmegaly Lungs: Clear to auscultation, no rhonchi or wheezes, or rib retractions  Heart: Regular rate and rhythm, no murmurs or gallops Breast:Examined in sitting and supine position were symmetrical in appearance, no palpable masses or tenderness,  no skin retraction, no nipple inversion, no nipple discharge, no skin discoloration, no axillary or supraclavicular lymphadenopathy Abdomen: no palpable masses or tenderness, no rebound or guarding Extremities: no edema or skin discoloration or tenderness  Pelvic: Vulva: Normal             Vagina: No gross lesions or discharge  Cervix: No gross lesions or discharge  Uterus  AV, normal size, shape and consistency, non-tender and mobile  Adnexa  Without masses or tenderness  Anus: Normal   Assessment/Plan:  53 y.o. female for annual exam   1. Well female exam with routine gynecological exam Normal gynecologic exam.  Last Pap test February 2018 was negative, no indication to repeat  this year.  Breast exam normal.  Screening mammogram March 2020 was negative.  Health labs with family physician.  Father with colon cancer, will verify with gastroenterology the timing of her next screening colonoscopy.  2. Postmenopause Well on no hormone replacement therapy.  No postmenopausal bleeding.  Recommend vitamin D supplements with calcium intake a total of 1.5 g/day and regular weightbearing physical activities.  3. Lichen sclerosus et atrophicus of the vulva Lichen sclerosus well  controlled on clobetasol ointment.  Clobetasol ointment represcribed.  Other orders - clobetasol ointment (TEMOVATE) 0.05 %; Apply 1 application topically 2 (two) times a week. Thin application on vulva twice a day for 2 weeks, then twice a week for long term use.  Genia Del MD, 8:26 AM 05/04/2018

## 2018-05-04 NOTE — Patient Instructions (Signed)
1. Well female exam with routine gynecological exam Normal gynecologic exam.  Last Pap test February 2018 was negative, no indication to repeat this year.  Breast exam normal.  Screening mammogram March 2020 was negative.  Health labs with family physician.  Father with colon cancer, will verify with gastroenterology the timing of her next screening colonoscopy.  2. Postmenopause Well on no hormone replacement therapy.  No postmenopausal bleeding.  Recommend vitamin D supplements with calcium intake a total of 1.5 g/day and regular weightbearing physical activities.  3. Lichen sclerosus et atrophicus of the vulva Lichen sclerosus well controlled on clobetasol ointment.  Clobetasol ointment represcribed.  Other orders - clobetasol ointment (TEMOVATE) 0.05 %; Apply 1 application topically 2 (two) times a week. Thin application on vulva twice a day for 2 weeks, then twice a week for long term use.  Jennifer Frye, it was a pleasure seeing you today!

## 2018-05-26 DIAGNOSIS — M722 Plantar fascial fibromatosis: Secondary | ICD-10-CM | POA: Diagnosis not present

## 2018-05-26 DIAGNOSIS — R202 Paresthesia of skin: Secondary | ICD-10-CM | POA: Diagnosis not present

## 2018-08-15 DIAGNOSIS — R202 Paresthesia of skin: Secondary | ICD-10-CM | POA: Diagnosis not present

## 2019-02-27 ENCOUNTER — Other Ambulatory Visit: Payer: Self-pay | Admitting: Obstetrics & Gynecology

## 2019-02-27 DIAGNOSIS — Z1231 Encounter for screening mammogram for malignant neoplasm of breast: Secondary | ICD-10-CM

## 2019-04-09 ENCOUNTER — Other Ambulatory Visit: Payer: Self-pay

## 2019-04-09 ENCOUNTER — Ambulatory Visit
Admission: RE | Admit: 2019-04-09 | Discharge: 2019-04-09 | Disposition: A | Discharge status: Home / Self Care | Payer: 59 | Source: Ambulatory Visit | Attending: Obstetrics & Gynecology | Admitting: Obstetrics & Gynecology

## 2019-04-09 DIAGNOSIS — Z1231 Encounter for screening mammogram for malignant neoplasm of breast: Secondary | ICD-10-CM

## 2019-05-15 ENCOUNTER — Other Ambulatory Visit: Payer: Self-pay

## 2019-05-16 ENCOUNTER — Encounter: Payer: Self-pay | Admitting: Obstetrics & Gynecology

## 2019-05-16 ENCOUNTER — Ambulatory Visit (INDEPENDENT_AMBULATORY_CARE_PROVIDER_SITE_OTHER): Payer: 59 | Admitting: Obstetrics & Gynecology

## 2019-05-16 VITALS — BP 142/90 | Ht 66.0 in | Wt 145.8 lb

## 2019-05-16 DIAGNOSIS — Z01419 Encounter for gynecological examination (general) (routine) without abnormal findings: Secondary | ICD-10-CM

## 2019-05-16 DIAGNOSIS — Z780 Asymptomatic menopausal state: Secondary | ICD-10-CM | POA: Diagnosis not present

## 2019-05-16 NOTE — Addendum Note (Signed)
Addended by: Berna Spare A on: 05/16/2019 11:33 AM   Modules accepted: Orders

## 2019-05-16 NOTE — Progress Notes (Signed)
BRIAH NARY [birth year] [id number]   History:    54 y.o. G2P2L2 Married.  Daughter Duwayne Heck is 37 yo, engaged.  Son doing well, enjoys pottery.  RP:  Established patient presenting for annual gyn exam   HPI: Menopause, well without hormone replacement therapy.  No postmenopausal bleeding.  No pelvic pain.  No pain with intercourse.  Urine and bowel movements normal.  Breast normal.  Body mass index good at 23.53.  Good fitness and nutrition.  Health labs with family physician.  Past medical history,surgical history, family history and social history were all reviewed and documented in the EPIC chart.  Gynecologic History Patient's last menstrual period was 05/16/2012.  Obstetric History OB History  Gravida Para Term Preterm AB Living  2 2       2   SAB TAB Ectopic Multiple Live Births               # Outcome Date GA Lbr Len/2nd Weight Sex Delivery Anes PTL Lv  2 Para           1 Para              ROS: A ROS was performed and pertinent positives and negatives are included in the history.  GENERAL: No fevers or chills. HEENT: No change in vision, no earache, sore throat or sinus congestion. NECK: No pain or stiffness. CARDIOVASCULAR: No chest pain or pressure. No palpitations. PULMONARY: No shortness of breath, cough or wheeze. GASTROINTESTINAL: No abdominal pain, nausea, vomiting or diarrhea, melena or bright red blood per rectum. GENITOURINARY: No urinary frequency, urgency, hesitancy or dysuria. MUSCULOSKELETAL: No joint or muscle pain, no back pain, no recent trauma. DERMATOLOGIC: No rash, no itching, no lesions. ENDOCRINE: No polyuria, polydipsia, no heat or cold intolerance. No recent change in weight. HEMATOLOGICAL: No anemia or easy bruising or bleeding. NEUROLOGIC: No headache, seizures, numbness, tingling or weakness. PSYCHIATRIC: No depression, no loss of interest in normal activity or change in sleep pattern.     Exam:   BP (!) 142/90   Ht 5\' 6"  (1.676 m)   Wt 145  lb 12.8 oz (66.1 kg)   LMP 05/16/2012   BMI 23.53 kg/m   Body mass index is 23.53 kg/m.  General appearance : Well developed well nourished female. No acute distress HEENT: Eyes: no retinal hemorrhage or exudates,  Neck supple, trachea midline, no carotid bruits, no thyroidmegaly Lungs: Clear to auscultation, no rhonchi or wheezes, or rib retractions  Heart: Regular rate and rhythm, no murmurs or gallops Breast:Examined in sitting and supine position were symmetrical in appearance, no palpable masses or tenderness,  no skin retraction, no nipple inversion, no nipple discharge, no skin discoloration, no axillary or supraclavicular lymphadenopathy Abdomen: no palpable masses or tenderness, no rebound or guarding Extremities: no edema or skin discoloration or tenderness  Pelvic: Vulva: Normal             Vagina: No gross lesions or discharge  Cervix: No gross lesions or discharge.  Pap reflex done.  Uterus AV, normal size, shape and consistency, non-tender and mobile  Adnexa  Without masses or tenderness  Anus: Normal   Assessment/Plan:  54 y.o. female for annual exam   1. Encounter for routine gynecological examination with Papanicolaou smear of cervix Normal gynecologic exam.  Pap reflex done.  Breast exam normal.  Screening mammogram March 2021 was negative.  Scheduling colonoscopy this year.  Health labs with family physician.  Very good body mass index at 23.53.  Continue with healthy nutrition and fitness.  2. Postmenopause Well on no hormone replacement therapy.  No postmenopausal bleeding.  Vitamin D supplements, calcium intake of 1200 mg daily and regular weightbearing physical activities.  Princess Bruins MD, 10:10 AM 05/16/2019

## 2019-05-16 NOTE — Patient Instructions (Signed)
1. Encounter for routine gynecological examination with Papanicolaou smear of cervix Normal gynecologic exam.  Pap reflex done.  Breast exam normal.  Screening mammogram March 2021 was negative.  Scheduling colonoscopy this year.  Health labs with family physician.  Very good body mass index at 23.53.  Continue with healthy nutrition and fitness.  2. Postmenopause Well on no hormone replacement therapy.  No postmenopausal bleeding.  Vitamin D supplements, calcium intake of 1200 mg daily and regular weightbearing physical activities.  [place], it was a pleasure seeing you today!  I will inform you of your results as soon as they are available.

## 2019-05-18 LAB — PAP IG W/ RFLX HPV ASCU

## 2019-11-17 ENCOUNTER — Other Ambulatory Visit: Payer: 59

## 2019-11-17 DIAGNOSIS — Z20822 Contact with and (suspected) exposure to covid-19: Secondary | ICD-10-CM

## 2019-11-19 LAB — SARS-COV-2, NAA 2 DAY TAT

## 2019-11-19 LAB — NOVEL CORONAVIRUS, NAA: SARS-CoV-2, NAA: NOT DETECTED

## 2020-03-04 ENCOUNTER — Other Ambulatory Visit: Payer: Self-pay | Admitting: Obstetrics & Gynecology

## 2020-03-04 DIAGNOSIS — Z1231 Encounter for screening mammogram for malignant neoplasm of breast: Secondary | ICD-10-CM

## 2020-04-18 ENCOUNTER — Ambulatory Visit
Admission: RE | Admit: 2020-04-18 | Discharge: 2020-04-18 | Disposition: A | Discharge status: Home / Self Care | Payer: 59 | Source: Ambulatory Visit | Attending: Obstetrics & Gynecology | Admitting: Obstetrics & Gynecology

## 2020-04-18 ENCOUNTER — Other Ambulatory Visit: Payer: Self-pay

## 2020-04-18 DIAGNOSIS — Z1231 Encounter for screening mammogram for malignant neoplasm of breast: Secondary | ICD-10-CM | POA: Diagnosis not present

## 2020-06-13 ENCOUNTER — Other Ambulatory Visit: Payer: Self-pay

## 2020-06-13 ENCOUNTER — Ambulatory Visit (INDEPENDENT_AMBULATORY_CARE_PROVIDER_SITE_OTHER): Payer: BC Managed Care – PPO | Admitting: Obstetrics & Gynecology

## 2020-06-13 ENCOUNTER — Encounter: Payer: Self-pay | Admitting: Obstetrics & Gynecology

## 2020-06-13 VITALS — BP 126/78 | Ht 66.0 in | Wt 150.0 lb

## 2020-06-13 DIAGNOSIS — Z01419 Encounter for gynecological examination (general) (routine) without abnormal findings: Secondary | ICD-10-CM

## 2020-06-13 DIAGNOSIS — Z780 Asymptomatic menopausal state: Secondary | ICD-10-CM

## 2020-06-13 NOTE — Progress Notes (Signed)
Jennifer Frye Jan 28, 1966 [id number]   History:    55 y.o. G2P2L2 Married.  Todd husband had eye cancer. Daughter Duwayne Heck is 38 yo, getting married in 3 weeks. Son 42 yo doing well, enjoys pottery.  WI:[id number] presenting for annual gyn exam   QQI:[id number], well without hormone replacement therapy. No postmenopausal bleeding. No pelvic pain. No pain with intercourse. Urine and bowel movements normal. Breast normal. Body mass index good at 24.21. Good fitness and nutrition. Health labs with family physician.   Past medical history,surgical history, family history and social history were all reviewed and documented in the EPIC chart.  Gynecologic History Patient's last menstrual period was 05/16/2012.  Obstetric History OB History  Gravida Para Term Preterm AB Living  2 2       2   SAB IAB Ectopic Multiple Live Births               # Outcome Date GA Lbr Len/2nd Weight Sex Delivery Anes PTL Lv  2 Para           1 Para              ROS: A ROS was performed and pertinent positives and negatives are included in the history.  GENERAL: No fevers or chills. HEENT: No change in vision, no earache, sore throat or sinus congestion. NECK: No pain or stiffness. CARDIOVASCULAR: No chest pain or pressure. No palpitations. PULMONARY: No shortness of breath, cough or wheeze. GASTROINTESTINAL: No abdominal pain, nausea, vomiting or diarrhea, melena or bright red blood per rectum. GENITOURINARY: No urinary frequency, urgency, hesitancy or dysuria. MUSCULOSKELETAL: No joint or muscle pain, no back pain, no recent trauma. DERMATOLOGIC: No rash, no itching, no lesions. ENDOCRINE: No polyuria, polydipsia, no heat or cold intolerance. No recent change in weight. HEMATOLOGICAL: No anemia or easy bruising or bleeding. NEUROLOGIC: No headache, seizures, numbness, tingling or weakness. PSYCHIATRIC: No depression, no loss of interest in normal activity or change in sleep pattern.      Exam:   BP 126/78   Ht 5\' 6"  (1.676 m)   Wt 150 lb (68 kg)   LMP 05/16/2012   BMI 24.21 kg/m   Body mass index is 24.21 kg/m.  General appearance : Well developed well nourished female. No acute distress HEENT: Eyes: no retinal hemorrhage or exudates,  Neck supple, trachea midline, no carotid bruits, no thyroidmegaly Lungs: Clear to auscultation, no rhonchi or wheezes, or rib retractions  Heart: Regular rate and rhythm, no murmurs or gallops Breast:Examined in sitting and supine position were symmetrical in appearance, no palpable masses or tenderness,  no skin retraction, no nipple inversion, no nipple discharge, no skin discoloration, no axillary or supraclavicular lymphadenopathy Abdomen: no palpable masses or tenderness, no rebound or guarding Extremities: no edema or skin discoloration or tenderness  Pelvic: Vulva: Normal             Vagina: No gross lesions or discharge  Cervix: No gross lesions or discharge  Uterus  AV, normal size, shape and consistency, non-tender and mobile  Adnexa  Without masses or tenderness  Anus: Normal   Assessment/Plan:  55 y.o. female for annual exam   1. Well female exam with routine gynecological exam Normal gynecologic exam.  Pap test April 2021 was negative, will repeat at 2 to 3 years.  Breast exam normal.  Screening mammogram March 2022 was negative.  Colonoscopy January 2022.  Health labs with family physician.  Good body mass index at 24.21.  Continue  with fitness and healthy nutrition.  2. Postmenopause Well on no hormone replacement therapy.  No postmenopausal bleeding.  Vitamin D supplements, calcium intake of 1.5 g/day total recommended.  Continue with regular weightbearing physical activities.  Other orders - Ascorbic Acid (VITAMIN C) 100 MG tablet; Take 100 mg by mouth daily. - b complex vitamins capsule; Take 1 capsule by mouth daily.  Genia Del MD, 9:46 AM 06/13/2020

## 2020-07-17 DIAGNOSIS — K469 Unspecified abdominal hernia without obstruction or gangrene: Secondary | ICD-10-CM | POA: Diagnosis not present

## 2020-08-14 DIAGNOSIS — Z13220 Encounter for screening for lipoid disorders: Secondary | ICD-10-CM | POA: Diagnosis not present

## 2020-08-14 DIAGNOSIS — Z0000 Encounter for general adult medical examination without abnormal findings: Secondary | ICD-10-CM | POA: Diagnosis not present

## 2020-08-21 DIAGNOSIS — Z0000 Encounter for general adult medical examination without abnormal findings: Secondary | ICD-10-CM | POA: Diagnosis not present

## 2020-08-29 DIAGNOSIS — H43811 Vitreous degeneration, right eye: Secondary | ICD-10-CM | POA: Diagnosis not present

## 2020-08-29 DIAGNOSIS — H2513 Age-related nuclear cataract, bilateral: Secondary | ICD-10-CM | POA: Diagnosis not present

## 2021-04-23 ENCOUNTER — Encounter: Payer: Self-pay | Admitting: Obstetrics & Gynecology

## 2021-04-23 DIAGNOSIS — Z1231 Encounter for screening mammogram for malignant neoplasm of breast: Secondary | ICD-10-CM | POA: Diagnosis not present

## 2021-06-16 ENCOUNTER — Ambulatory Visit: Payer: BC Managed Care – PPO | Admitting: Obstetrics & Gynecology

## 2021-06-22 ENCOUNTER — Ambulatory Visit (INDEPENDENT_AMBULATORY_CARE_PROVIDER_SITE_OTHER): Payer: BC Managed Care – PPO | Admitting: Obstetrics & Gynecology

## 2021-06-22 ENCOUNTER — Encounter: Payer: Self-pay | Admitting: Obstetrics & Gynecology

## 2021-06-22 VITALS — BP 118/78 | Ht 65.5 in | Wt 155.0 lb

## 2021-06-22 DIAGNOSIS — Z780 Asymptomatic menopausal state: Secondary | ICD-10-CM | POA: Diagnosis not present

## 2021-06-22 DIAGNOSIS — Z01419 Encounter for gynecological examination (general) (routine) without abnormal findings: Secondary | ICD-10-CM

## 2021-06-22 NOTE — Progress Notes (Signed)
Jennifer Frye [birth year] [id number]   History:    56 y.o. G2P2L2 Married.  Todd husband had eye cancer.  Daughter Duwayne Heck is 45 yo, married.  Son 52 yo doing well, enjoys pottery.   RP:  Established patient presenting for annual gyn exam    HPI: Postmenopause, well without hormone replacement therapy.  No postmenopausal bleeding.  No pelvic pain.  No pain with intercourse.  Pap Neg 05/2019.  No h/o abnormal Pap.  Will repeat Pap at 3 years. Urine and bowel movements normal.  Breasts normal. Mammo 04/2021 Neg.  Body mass index good at 25.4.  Good fitness and nutrition.  Health labs with family physician. Colono 10/2019.     Past medical history,surgical history, family history and social history were all reviewed and documented in the EPIC chart.  Gynecologic History Patient's last menstrual period was 05/16/2012.  Obstetric History OB History  Gravida Para Term Preterm AB Living  2 2       2   SAB IAB Ectopic Multiple Live Births               # Outcome Date GA Lbr Len/2nd Weight Sex Delivery Anes PTL Lv  2 Para           1 Para              ROS: A ROS was performed and pertinent positives and negatives are included in the history. GENERAL: No fevers or chills. HEENT: No change in vision, no earache, sore throat or sinus congestion. NECK: No pain or stiffness. CARDIOVASCULAR: No chest pain or pressure. No palpitations. PULMONARY: No shortness of breath, cough or wheeze. GASTROINTESTINAL: No abdominal pain, nausea, vomiting or diarrhea, melena or bright red blood per rectum. GENITOURINARY: No urinary frequency, urgency, hesitancy or dysuria. MUSCULOSKELETAL: No joint or muscle pain, no back pain, no recent trauma. DERMATOLOGIC: No rash, no itching, no lesions. ENDOCRINE: No polyuria, polydipsia, no heat or cold intolerance. No recent change in weight. HEMATOLOGICAL: No anemia or easy bruising or bleeding. NEUROLOGIC: No headache, seizures, numbness, tingling or weakness. PSYCHIATRIC: No  depression, no loss of interest in normal activity or change in sleep pattern.     Exam:   BP 118/78   Ht 5' 5.5" (1.664 m)   Wt 155 lb (70.3 kg)   LMP 05/16/2012   BMI 25.40 kg/m   Body mass index is 25.4 kg/m.  General appearance : Well developed well nourished female. No acute distress HEENT: Eyes: no retinal hemorrhage or exudates,  Neck supple, trachea midline, no carotid bruits, no thyroidmegaly Lungs: Clear to auscultation, no rhonchi or wheezes, or rib retractions  Heart: Regular rate and rhythm, no murmurs or gallops Breast:Examined in sitting and supine position were symmetrical in appearance, no palpable masses or tenderness,  no skin retraction, no nipple inversion, no nipple discharge, no skin discoloration, no axillary or supraclavicular lymphadenopathy Abdomen: no palpable masses or tenderness, no rebound or guarding Extremities: no edema or skin discoloration or tenderness  Pelvic: Vulva: Normal             Vagina: No gross lesions or discharge  Cervix: No gross lesions or discharge  Uterus  AV, normal size, shape and consistency, non-tender and mobile  Adnexa  Without masses or tenderness  Anus: Normal   Assessment/Plan:  56 y.o. female for annual exam   1. Well female exam with routine gynecological exam Postmenopause, well without hormone replacement therapy.  No postmenopausal bleeding.  No pelvic pain.  No pain with intercourse.  Pap Neg 05/2019.  No h/o abnormal Pap.  Will repeat Pap at 3 years. Urine and bowel movements normal.  Breasts normal. Mammo 04/2021 Neg.  Body mass index good at 25.4.  Good fitness and nutrition.  Health labs with family physician. Colono 10/2019.   2. Postmenopause  Postmenopause, well without hormone replacement therapy.  No postmenopausal bleeding.  No pelvic pain.  No pain with intercourse.    Genia Del MD, 8:41 AM 06/22/2021

## 2021-07-06 DIAGNOSIS — J019 Acute sinusitis, unspecified: Secondary | ICD-10-CM | POA: Diagnosis not present

## 2021-07-06 DIAGNOSIS — R059 Cough, unspecified: Secondary | ICD-10-CM | POA: Diagnosis not present

## 2021-08-31 DIAGNOSIS — Z0000 Encounter for general adult medical examination without abnormal findings: Secondary | ICD-10-CM | POA: Diagnosis not present

## 2021-08-31 DIAGNOSIS — Z13220 Encounter for screening for lipoid disorders: Secondary | ICD-10-CM | POA: Diagnosis not present

## 2021-09-07 DIAGNOSIS — Z0000 Encounter for general adult medical examination without abnormal findings: Secondary | ICD-10-CM | POA: Diagnosis not present

## 2021-09-08 ENCOUNTER — Other Ambulatory Visit: Payer: Self-pay | Admitting: Family Medicine

## 2021-09-08 DIAGNOSIS — E7800 Pure hypercholesterolemia, unspecified: Secondary | ICD-10-CM

## 2021-09-22 ENCOUNTER — Other Ambulatory Visit (HOSPITAL_BASED_OUTPATIENT_CLINIC_OR_DEPARTMENT_OTHER): Payer: Self-pay | Admitting: Family Medicine

## 2021-09-22 DIAGNOSIS — E7800 Pure hypercholesterolemia, unspecified: Secondary | ICD-10-CM

## 2021-11-02 ENCOUNTER — Ambulatory Visit (HOSPITAL_BASED_OUTPATIENT_CLINIC_OR_DEPARTMENT_OTHER)
Admission: RE | Admit: 2021-11-02 | Discharge: 2021-11-02 | Disposition: A | Discharge status: Home / Self Care | Payer: Self-pay | Source: Ambulatory Visit | Attending: Family Medicine | Admitting: Family Medicine

## 2021-11-02 DIAGNOSIS — E7800 Pure hypercholesterolemia, unspecified: Secondary | ICD-10-CM | POA: Insufficient documentation

## 2021-12-10 DIAGNOSIS — R059 Cough, unspecified: Secondary | ICD-10-CM | POA: Diagnosis not present

## 2021-12-27 IMAGING — MG DIGITAL SCREENING BILAT W/ TOMO W/ CAD
8 series · 8 of 24 positions shown · non-contrast
Comparison: Previous exam(s).

CLINICAL DATA: Screening.

EXAM:
DIGITAL SCREENING BILATERAL MAMMOGRAM WITH TOMO AND CAD

[L CC synth-2D]
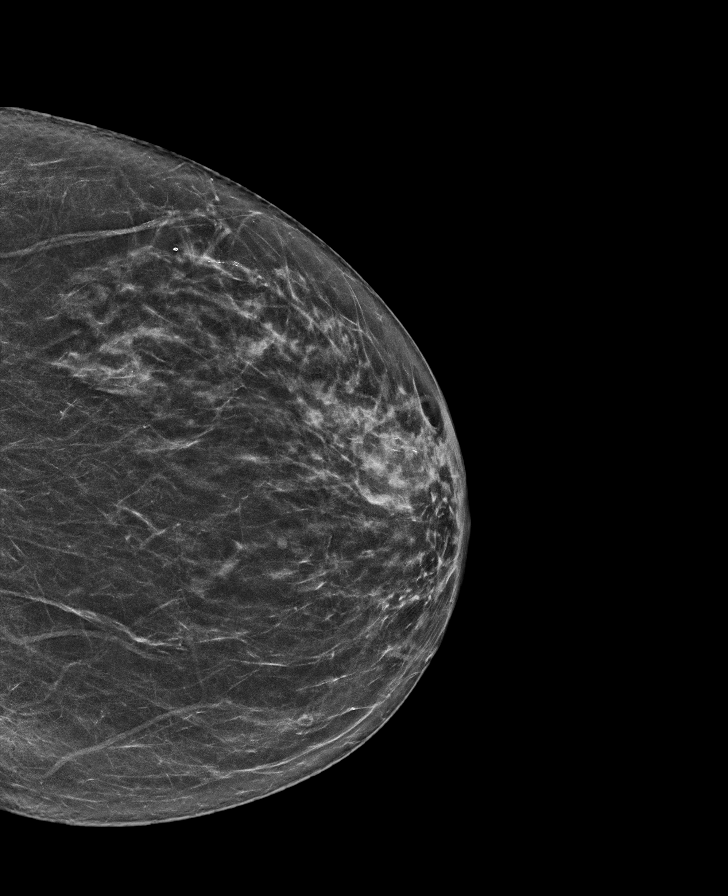

[L MLO synth-2D]
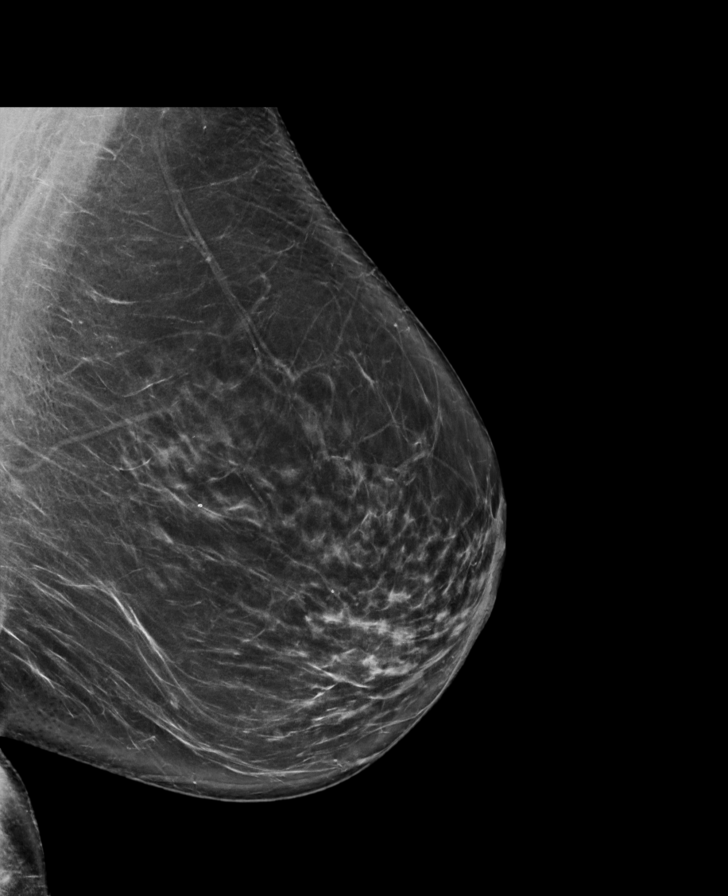

[R MLO synth-2D]
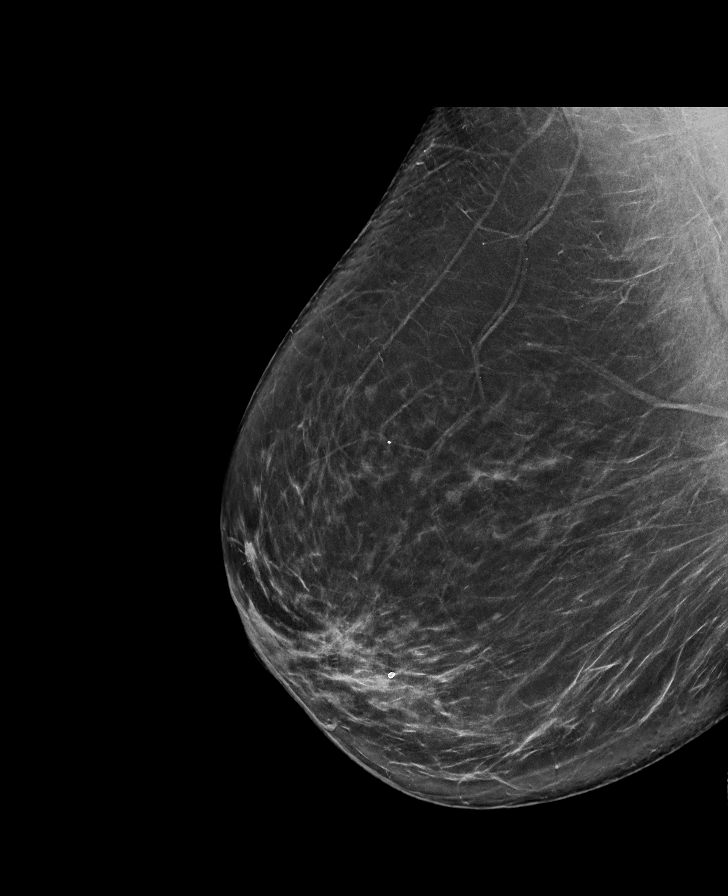

[R CC synth-2D]
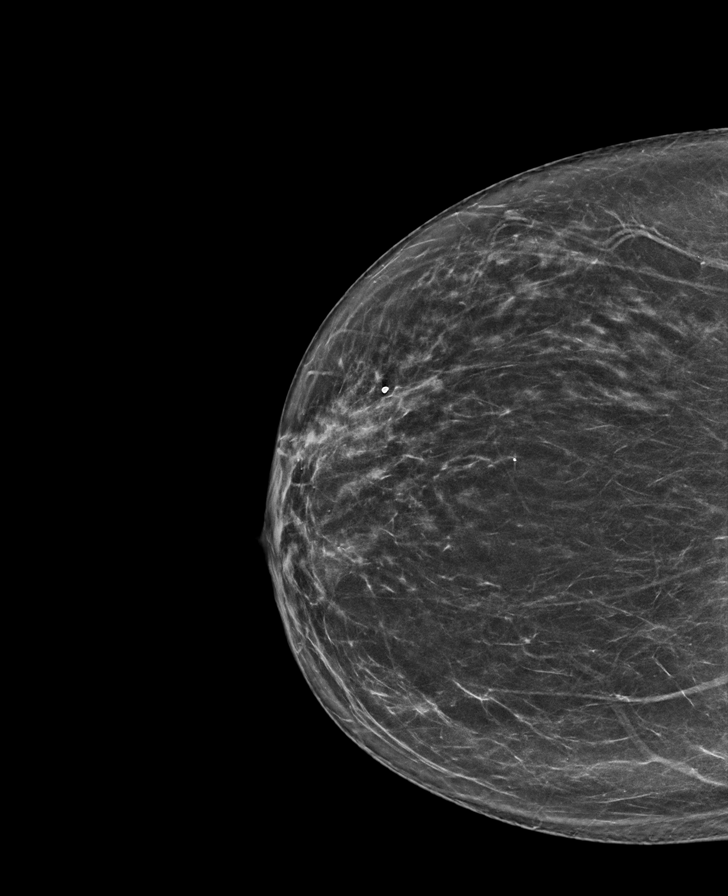

[L MLO tomo · tomo slice 43/85.0]
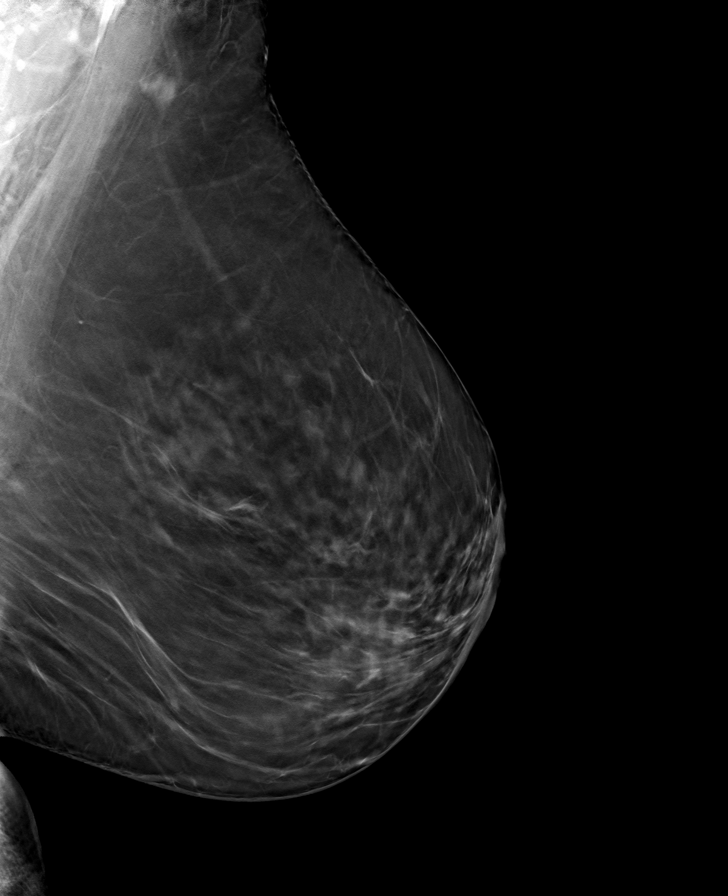

[R CC tomo · tomo slice 35/70.0]
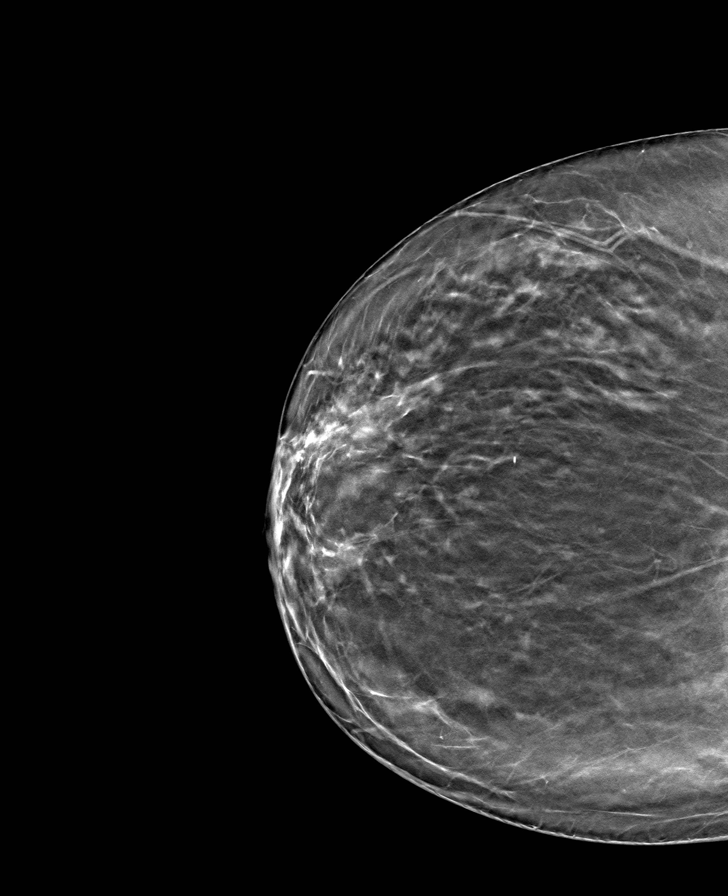

[L CC tomo · tomo slice 35/68.0]
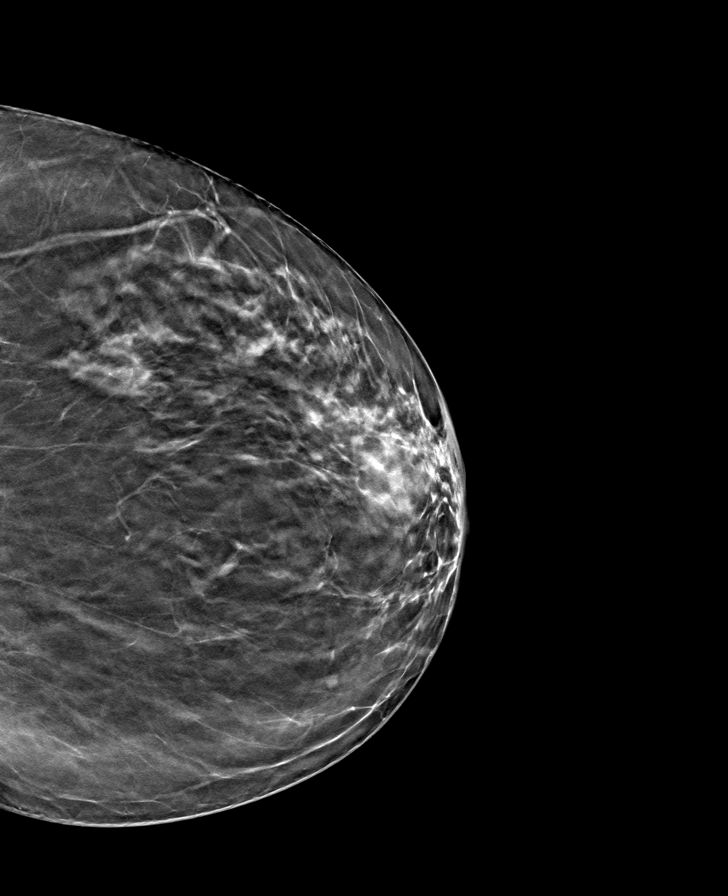

[R MLO tomo · tomo slice 43/85.0]
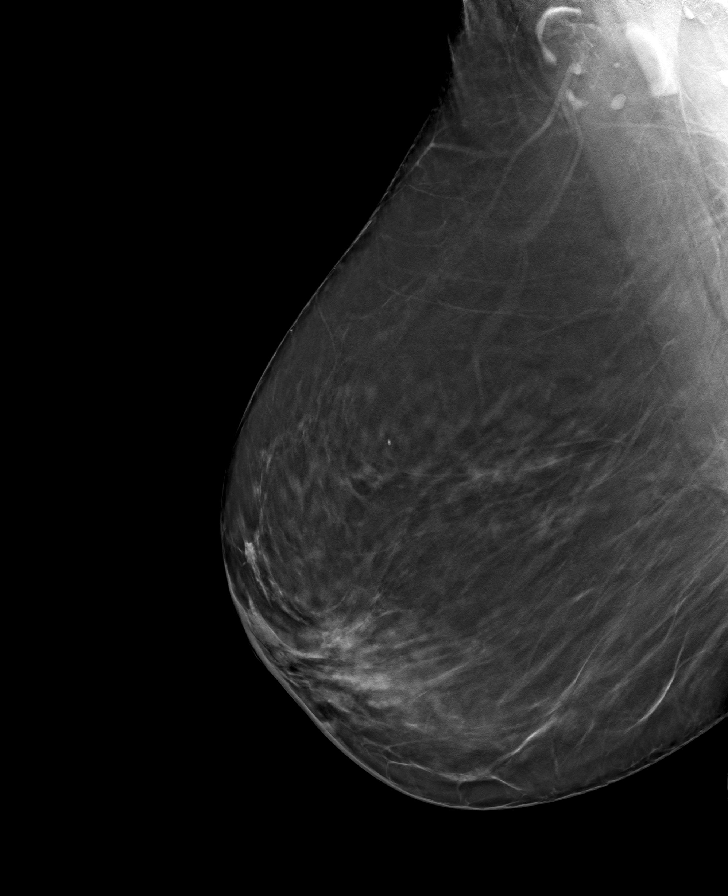

[8 of 24 positions shown; findings below may reference images not displayed]

ACR Breast Density Category b: There are scattered areas of
fibroglandular density.
FINDINGS: There are no findings suspicious for malignancy. Images were
processed with CAD.
IMPRESSION: No mammographic evidence of malignancy. A result letter of this
screening mammogram will be mailed directly to the patient.

RECOMMENDATION:
Screening mammogram in one year. (Code:[id number])

BI-RADS CATEGORY  1: Negative.

## 2022-03-04 DIAGNOSIS — H2513 Age-related nuclear cataract, bilateral: Secondary | ICD-10-CM | POA: Diagnosis not present

## 2022-03-04 DIAGNOSIS — H52203 Unspecified astigmatism, bilateral: Secondary | ICD-10-CM | POA: Diagnosis not present

## 2022-03-04 DIAGNOSIS — H35371 Puckering of macula, right eye: Secondary | ICD-10-CM | POA: Diagnosis not present

## 2022-03-04 DIAGNOSIS — H524 Presbyopia: Secondary | ICD-10-CM | POA: Diagnosis not present

## 2022-03-04 DIAGNOSIS — H5213 Myopia, bilateral: Secondary | ICD-10-CM | POA: Diagnosis not present

## 2022-03-04 DIAGNOSIS — E7800 Pure hypercholesterolemia, unspecified: Secondary | ICD-10-CM | POA: Diagnosis not present

## 2022-06-15 DIAGNOSIS — H43811 Vitreous degeneration, right eye: Secondary | ICD-10-CM | POA: Diagnosis not present

## 2022-06-15 DIAGNOSIS — H4423 Degenerative myopia, bilateral: Secondary | ICD-10-CM | POA: Diagnosis not present

## 2022-06-15 DIAGNOSIS — H2513 Age-related nuclear cataract, bilateral: Secondary | ICD-10-CM | POA: Diagnosis not present

## 2022-07-21 ENCOUNTER — Ambulatory Visit: Payer: BC Managed Care – PPO | Admitting: Obstetrics & Gynecology

## 2022-07-26 ENCOUNTER — Ambulatory Visit (INDEPENDENT_AMBULATORY_CARE_PROVIDER_SITE_OTHER): Payer: BC Managed Care – PPO | Admitting: Obstetrics & Gynecology

## 2022-07-26 ENCOUNTER — Encounter: Payer: Self-pay | Admitting: Obstetrics & Gynecology

## 2022-07-26 ENCOUNTER — Other Ambulatory Visit (HOSPITAL_COMMUNITY)
Admission: RE | Admit: 2022-07-26 | Discharge: 2022-07-26 | Disposition: A | Discharge status: Home / Self Care | Payer: BC Managed Care – PPO | Source: Ambulatory Visit | Attending: Obstetrics & Gynecology | Admitting: Obstetrics & Gynecology

## 2022-07-26 VITALS — BP 128/72 | HR 78 | Ht 67.0 in | Wt 163.0 lb

## 2022-07-26 DIAGNOSIS — Z780 Asymptomatic menopausal state: Secondary | ICD-10-CM

## 2022-07-26 DIAGNOSIS — Z01419 Encounter for gynecological examination (general) (routine) without abnormal findings: Secondary | ICD-10-CM

## 2022-07-26 NOTE — Progress Notes (Signed)
Jennifer Frye 07-Dec-1965 [id number]   History:    57 y.o. G2P2L2 Married.  Todd husband had eye cancer.  Daughter Duwayne Heck is 63 yo, married.  Son 24 yo doing well, enjoys pottery.   RP:  Established patient presenting for annual gyn exam    HPI: Postmenopause, well without hormone replacement therapy.  No postmenopausal bleeding.  No pelvic pain.  No pain with intercourse.  Pap Neg 05/2019.  No h/o abnormal Pap.  Pap reflex today. Urine and bowel movements normal. Breasts normal. Mammo 04/2021 Neg. Schedule Mammo now. Body mass index good at 25.53.  Good fitness and nutrition.  Health labs with family physician. Colono 10/2019.   Past medical history,surgical history, family history and social history were all reviewed and documented in the EPIC chart.  Gynecologic History Patient's last menstrual period was 05/16/2012.  Obstetric History OB History  Gravida Para Term Preterm AB Living  2 2       2   SAB IAB Ectopic Multiple Live Births               # Outcome Date GA Lbr Len/2nd Weight Sex Delivery Anes PTL Lv  2 Para           1 Para              ROS: A ROS was performed and pertinent positives and negatives are included in the history. GENERAL: No fevers or chills. HEENT: No change in vision, no earache, sore throat or sinus congestion. NECK: No pain or stiffness. CARDIOVASCULAR: No chest pain or pressure. No palpitations. PULMONARY: No shortness of breath, cough or wheeze. GASTROINTESTINAL: No abdominal pain, nausea, vomiting or diarrhea, melena or bright red blood per rectum. GENITOURINARY: No urinary frequency, urgency, hesitancy or dysuria. MUSCULOSKELETAL: No joint or muscle pain, no back pain, no recent trauma. DERMATOLOGIC: No rash, no itching, no lesions. ENDOCRINE: No polyuria, polydipsia, no heat or cold intolerance. No recent change in weight. HEMATOLOGICAL: No anemia or easy bruising or bleeding. NEUROLOGIC: No headache, seizures, numbness, tingling or weakness. PSYCHIATRIC:  No depression, no loss of interest in normal activity or change in sleep pattern.     Exam:   BP 128/72   Pulse 78   Ht 5\' 7"  (1.702 m)   Wt 163 lb (73.9 kg)   LMP 05/16/2012   SpO2 100%   BMI 25.53 kg/m   Body mass index is 25.53 kg/m.  General appearance : Well developed well nourished female. No acute distress HEENT: Eyes: no retinal hemorrhage or exudates,  Neck supple, trachea midline, no carotid bruits, no thyroidmegaly Lungs: Clear to auscultation, no rhonchi or wheezes, or rib retractions  Heart: Regular rate and rhythm, no murmurs or gallops Breast:Examined in sitting and supine position were symmetrical in appearance, no palpable masses or tenderness,  no skin retraction, no nipple inversion, no nipple discharge, no skin discoloration, no axillary or supraclavicular lymphadenopathy Abdomen: no palpable masses or tenderness, no rebound or guarding Extremities: no edema or skin discoloration or tenderness  Pelvic: Vulva: Normal             Vagina: No gross lesions or discharge.  Small vaginal polyp at the Rt mid vagina.  Agrees with observation.  Cervix: No gross lesions or discharge.  Pap reflex done.  Uterus  AV, normal size, shape and consistency, non-tender and mobile  Adnexa  Without masses or tenderness  Anus: Normal   Assessment/Plan:  57 y.o. female for annual exam   1. Encounter  for routine gynecological examination with Papanicolaou smear of cervix Postmenopause, well without hormone replacement therapy.  No postmenopausal bleeding.  No pelvic pain.  No pain with intercourse.  Pap Neg 05/2019.  No h/o abnormal Pap.  Pap reflex today. Urine and bowel movements normal. Breasts normal. Mammo 04/2021 Neg. Schedule Mammo now. Body mass index good at 25.53.  Good fitness and nutrition.  Health labs with family physician. Colono 10/2019. - Cytology - PAP( )  2. Postmenopause  Postmenopause, well without hormone replacement therapy.  No postmenopausal  bleeding.  No pelvic pain.  No pain with intercourse.  Genia Del MD, 3:29 PM

## 2022-07-29 LAB — CYTOLOGY - PAP: Diagnosis: NEGATIVE

## 2022-08-12 DIAGNOSIS — Z1231 Encounter for screening mammogram for malignant neoplasm of breast: Secondary | ICD-10-CM | POA: Diagnosis not present

## 2022-08-12 DIAGNOSIS — R92323 Mammographic fibroglandular density, bilateral breasts: Secondary | ICD-10-CM | POA: Diagnosis not present

## 2022-10-18 DIAGNOSIS — Z13220 Encounter for screening for lipoid disorders: Secondary | ICD-10-CM | POA: Diagnosis not present

## 2022-10-18 DIAGNOSIS — Z0000 Encounter for general adult medical examination without abnormal findings: Secondary | ICD-10-CM | POA: Diagnosis not present

## 2022-10-22 DIAGNOSIS — Z0000 Encounter for general adult medical examination without abnormal findings: Secondary | ICD-10-CM | POA: Diagnosis not present

## 2023-02-14 ENCOUNTER — Other Ambulatory Visit: Payer: Self-pay | Admitting: General Surgery

## 2023-02-14 DIAGNOSIS — R1900 Intra-abdominal and pelvic swelling, mass and lump, unspecified site: Secondary | ICD-10-CM

## 2023-07-14 ENCOUNTER — Encounter (INDEPENDENT_AMBULATORY_CARE_PROVIDER_SITE_OTHER): Payer: Self-pay | Admitting: Otolaryngology

## 2023-07-14 ENCOUNTER — Ambulatory Visit (INDEPENDENT_AMBULATORY_CARE_PROVIDER_SITE_OTHER): Admitting: Otolaryngology

## 2023-07-14 VITALS — BP 148/93 | HR 103

## 2023-07-14 DIAGNOSIS — R0989 Other specified symptoms and signs involving the circulatory and respiratory systems: Secondary | ICD-10-CM | POA: Diagnosis not present

## 2023-07-14 DIAGNOSIS — R0981 Nasal congestion: Secondary | ICD-10-CM

## 2023-07-14 DIAGNOSIS — H6992 Unspecified Eustachian tube disorder, left ear: Secondary | ICD-10-CM

## 2023-07-14 DIAGNOSIS — J343 Hypertrophy of nasal turbinates: Secondary | ICD-10-CM

## 2023-07-14 DIAGNOSIS — R0982 Postnasal drip: Secondary | ICD-10-CM | POA: Diagnosis not present

## 2023-07-14 DIAGNOSIS — R09A2 Foreign body sensation, throat: Secondary | ICD-10-CM

## 2023-07-14 DIAGNOSIS — K219 Gastro-esophageal reflux disease without esophagitis: Secondary | ICD-10-CM

## 2023-07-14 DIAGNOSIS — J3089 Other allergic rhinitis: Secondary | ICD-10-CM

## 2023-07-14 DIAGNOSIS — J342 Deviated nasal septum: Secondary | ICD-10-CM

## 2023-07-14 DIAGNOSIS — M26622 Arthralgia of left temporomandibular joint: Secondary | ICD-10-CM

## 2023-07-14 MED ORDER — FLUTICASONE PROPIONATE 50 MCG/ACT NA SUSP: 2.0000 | Freq: Every day | NASAL | 6 refills | Status: DC

## 2023-07-14 MED ORDER — LEVOCETIRIZINE DIHYDROCHLORIDE 5 MG PO TABS: 5.0000 mg | ORAL_TABLET | Freq: Every evening | ORAL | 3 refills | Status: DC

## 2023-07-14 NOTE — Patient Instructions (Signed)
 Jennifer Frye Med Nasal Saline Rinse   - start nasal saline rinses with [employer] available over the counter or online to help with nasal congestion     TMJ (Temporomandibular Joint Syndrome) The temporomandibular (tem-puh-[id number]-man-DIB-u-lur) joint (TMJ) acts like a sliding hinge, connecting your jawbone to your skull. You have one joint on each side of your jaw. TMJ disorders -- a type of temporomandibular disorder or TMD -- can cause pain in your jaw joint and in the muscles that control jaw movement.  The exact cause of a person's TMJ disorder is often difficult to determine. Your pain may be due to a combination of factors, such as genetics, arthritis or jaw injury. Some people who have jaw pain also tend to clench or grind their teeth (bruxism), although many people habitually clench or grind their teeth and never develop TMJ disorders.  In most cases, the pain and discomfort associated with TMJ disorders is temporary and can be relieved with self-managed care or nonsurgical treatments. This includes stress reduction, softer diet when the pain is present, anti-inflammatory pain medications such as Motrin and warm compresses.

## 2023-07-14 NOTE — Progress Notes (Signed)
 ENT CONSULT:  Reason for Consult: left otalgia, phlegm in the throat chronic nasal congestion and drainage    HPI: Discussed the use of AI scribe software for clinical note transcription with the patient, who gave verbal consent to proceed.  History of Present Illness   Jennifer Frye is a 58 year old female who presents with ear pain and postnasal drainage/phlegm in her throat. She was referred by her GP for specialist evaluation.  She experiences a clogged sensation and dull pain in her left ear, which occurs intermittently. The pain has been present for the last few weeks and is associated with more pain when opening her mouth upon waking.  She has persistent phlegm and postnasal drainage since Thanksgiving, following a severe upper respiratory infection. Despite two rounds of antibiotics, the symptoms persist. She experiences significant coughing in the morning and has tried over-the-counter expectorants without relief.  She has not been tested for allergies and has never used allergy medications before. She reports seasonal symptoms but has not been on any nasal sprays. No significant earwax accumulation and no history of ear infections.      Records Reviewed:  PCP visit notes 02/03/23 In the meantime just a few days ago I started getting really bad mid-back pain in my left side - just as it had been 12 years ago when it was discovered that I had a UPJ obstruction and laproscopic surgery had to be done to get the kink out of the ureter. It was determined by Dr. Rozanne Corners [facility]) that it was probably congenital. It relieved all of the achey pain in that area. I'm concerned now though that a dozen years later there may be a new twist/kink somewhere there, and I'm thinking I need to be seen by him again (testing rate would show how long it takes fluid to go through the ureter, etc.) Should I call there to set it up and do I need a referral? The achiness does not go away - and it does  intensify at times, only on that side of the previous surgery    Past Medical History:  Diagnosis Date   Tingling     Past Surgical History:  Procedure Laterality Date   CARPAL TUNNEL WITH CUBITAL TUNNEL Right    ELBOW    Family History  Problem Relation Age of Onset   Hypertension Father    Colon cancer Father    Prostate cancer Father    Dementia Father    Hypertension Sister    Healthy Mother    Breast cancer Neg Hx     Social History:  reports that she has never smoked. She has never used smokeless tobacco. She reports current alcohol use. She reports that she does not use drugs.  Allergies:  Allergies  Allergen Reactions   Codeine Hives   Penicillins Hives    Medications: I have reviewed the patient's current medications.  The PMH, PSH, Medications, Allergies, and SH were reviewed and updated.  ROS: Constitutional: Negative for fever, weight loss and weight gain. Cardiovascular: Negative for chest pain and dyspnea on exertion. Respiratory: Is not experiencing shortness of breath at rest. Gastrointestinal: Negative for nausea and vomiting. Neurological: Negative for headaches. Psychiatric: The patient is not nervous/anxious  Blood pressure (!) 148/93, pulse (!) 103, last menstrual period 05/16/2012, SpO2 94%.  PHYSICAL EXAM:  Exam: Blood pressure (!) 148/93, pulse (!) 103, last menstrual period 05/16/2012, SpO2 94%. There is no height or weight on file to calculate BMI.  PHYSICAL EXAM:  Exam: General: Well-developed, well-nourished Communication and Voice: Clear pitch and clarity Respiratory Respiratory effort: Equal inspiration and expiration without stridor Cardiovascular Peripheral Vascular: Warm extremities with equal color/perfusion Eyes: No nystagmus with equal extraocular motion bilaterally Neuro/Psych/Balance: Patient oriented to person, place, and time; Appropriate mood and affect; Gait is intact with no imbalance; Cranial nerves I-XII are  intact Head and Face Inspection: Normocephalic and atraumatic without mass or lesion Palpation: Facial skeleton intact without bony stepoffs Salivary Glands: No mass or tenderness Facial Strength: Facial motility symmetric and full bilaterally ENT Pinna: External ear intact and fully developed External canal: Canal is patent with intact skin Tympanic Membrane: Clear and mobile External Nose: No scar or anatomic deformity Internal Nose: Septum is deviated to the left and with caudal spur on the right. No polyp, or purulence. Mucosal edema and erythema present.  Bilateral inferior turbinate hypertrophy.  Lips, Teeth, and gums: Mucosa and teeth intact and viable TMJ: No pain to palpation with full mobility Oral cavity/oropharynx: No erythema or exudate, no lesions present Nasopharynx: No mass or lesion with intact mucosa Hypopharynx: Intact mucosa without pooling of secretions Larynx Glottic: Full true vocal cord mobility without lesion or mass Supraglottic: Normal appearing epiglottis and AE folds Interarytenoid Space: Moderate pachydermia&edema Subglottic Space: Patent without lesion or edema Neck Neck and Trachea: Midline trachea without mass or lesion Thyroid: No mass or nodularity Lymphatics: No lymphadenopathy   Procedure: Preoperative diagnosis: globus and phlegm in her throat   Postoperative diagnosis:   Same + PND and GERD LPR  Procedure: Flexible fiberoptic laryngoscopy  Surgeon: Eufemio Strahm, MD  Anesthesia: Topical lidocaine and Afrin Complications: None Condition is stable throughout exam  Indications and consent:  The patient presents to the clinic with above symptoms. Indirect laryngoscopy view was incomplete. Thus it was recommended that they undergo a flexible fiberoptic laryngoscopy. All of the risks, benefits, and potential complications were reviewed with the patient preoperatively and verbal informed consent was obtained.  Procedure: The patient was  seated upright in the clinic. Topical lidocaine and Afrin were applied to the nasal cavity. After adequate anesthesia had occurred, I then proceeded to pass the flexible telescope into the nasal cavity. The nasal cavity was patent without rhinorrhea or polyp. The nasopharynx was also patent without mass or lesion. The base of tongue was visualized and was normal. There were no signs of pooling of secretions in the piriform sinuses. The true vocal folds were mobile bilaterally. There were no signs of glottic or supraglottic mucosal lesion or mass. There was moderate interarytenoid pachydermia and post cricoid edema. The telescope was then slowly withdrawn and the patient tolerated the procedure throughout.    PROCEDURE NOTE: nasal endoscopy  Preoperative diagnosis: chronic sinusitis symptoms  Postoperative diagnosis: same  Procedure: Diagnostic nasal endoscopy (82956)  Surgeon: Artice Last, M.D.  Anesthesia: Topical lidocaine and Afrin  H&P REVIEW: The patient's history and physical were reviewed today prior to procedure. All medications were reviewed and updated as well. Complications: None Condition is stable throughout exam Indications and consent: The patient presents with symptoms of chronic sinusitis not responding to previous therapies. All the risks, benefits, and potential complications were reviewed with the patient preoperatively and informed consent was obtained. The time out was completed with confirmation of the correct procedure.   Procedure: The patient was seated upright in the clinic. Topical lidocaine and Afrin were applied to the nasal cavity. After adequate anesthesia had occurred, the rigid nasal endoscope was passed into the nasal cavity.  The nasal mucosa, turbinates, septum, and sinus drainage pathways were visualized bilaterally. This revealed no purulence or significant secretions that might be cultured. There were no polyps or sites of significant inflammation. The  mucosa was intact and there was no crusting present. The scope was then slowly withdrawn and the patient tolerated the procedure well. There were no complications or blood loss.   Assessment/Plan: Encounter Diagnoses  Name Primary?   Dysfunction of left eustachian tube Yes   Arthralgia of left temporomandibular joint    Post-nasal drip    Chronic nasal congestion    Environmental and seasonal allergies    Chronic GERD    Nasal septal deviation    Hypertrophy of both inferior nasal turbinates    Globus sensation [R09.A2]    Phlegm in throat [R09.89]     Assessment and Plan    TMJ disorder with left otalgia  Suspected TMJ disorder due to left jaw pain and difficulty opening mouth, likely from nocturnal bruxism. Ear exam is unremarkable. Pain not otogenic. - Provided TMJ disorder supportive care instructions. - Recommended soft diet and ibuprofen for pain. - Discussed potential treatments: Botox, mouth guard, acupuncture if supportive care fails. These would be discussed with OMFS and we discussed OMFS consult in the future if sx persist.   Postnasal drainage Chronic nasal congestion and Environmental allergies  Chronic postnasal drainage and phlegm likely from allergies, no prior allergy testing or treatment. Nasal endoscopy without polyps or purulence, but she had septal deviation and clear post-nasal drainage  - Prescribed Xyzal and Flonase. - Recommended nasal saline rinses. - Discussed allergy testing and potential immunotherapy if empiric treatment fails.     Sensation of phlegm and globus  Flexible scope overall unremarkable with mild changes c/w GERD LPR  - diet and lifestyle changes to minimize reflux  - medical management of post-nasal drainage   Thank you for allowing me to participate in the care of this patient. Please do not hesitate to contact me with any questions or concerns.   Artice Last, MD Otolaryngology [facility] Phone:  [phone] Fax: [phone]    07/14/2023, 9:17 AM

## 2023-10-07 ENCOUNTER — Telehealth (INDEPENDENT_AMBULATORY_CARE_PROVIDER_SITE_OTHER): Payer: Self-pay

## 2023-10-07 NOTE — Telephone Encounter (Signed)
 Patient called and explained that she is going to a new ENT provider. She wants to know if we could upload images from her nasal endoscopy or somehow upload the video from the nasal endoscopy. Please advise.

## 2023-10-12 ENCOUNTER — Telehealth (INDEPENDENT_AMBULATORY_CARE_PROVIDER_SITE_OTHER): Payer: Self-pay

## 2023-10-12 NOTE — Telephone Encounter (Signed)
 LVM for patient letting her know that we do not have the technology to upload any images or videos from her endoscopy.

## 2023-11-15 ENCOUNTER — Other Ambulatory Visit (HOSPITAL_COMMUNITY): Payer: Self-pay | Admitting: Family Medicine

## 2023-11-15 DIAGNOSIS — R0982 Postnasal drip: Secondary | ICD-10-CM

## 2023-12-09 ENCOUNTER — Encounter (HOSPITAL_BASED_OUTPATIENT_CLINIC_OR_DEPARTMENT_OTHER): Payer: Self-pay

## 2023-12-14 ENCOUNTER — Ambulatory Visit (HOSPITAL_BASED_OUTPATIENT_CLINIC_OR_DEPARTMENT_OTHER)
Admission: RE | Admit: 2023-12-14 | Discharge: 2023-12-14 | Disposition: A | Discharge status: Home / Self Care | Source: Ambulatory Visit | Attending: Family Medicine | Admitting: Family Medicine

## 2023-12-14 DIAGNOSIS — R0982 Postnasal drip: Secondary | ICD-10-CM | POA: Insufficient documentation

## 2023-12-16 ENCOUNTER — Ambulatory Visit (HOSPITAL_BASED_OUTPATIENT_CLINIC_OR_DEPARTMENT_OTHER)

## 2024-01-06 ENCOUNTER — Ambulatory Visit (HOSPITAL_BASED_OUTPATIENT_CLINIC_OR_DEPARTMENT_OTHER): Admitting: Cardiology

## 2024-01-06 ENCOUNTER — Encounter (HOSPITAL_BASED_OUTPATIENT_CLINIC_OR_DEPARTMENT_OTHER): Payer: Self-pay | Admitting: Cardiology

## 2024-01-06 VITALS — BP 146/92 | HR 92 | Ht 67.0 in | Wt 174.4 lb

## 2024-01-06 DIAGNOSIS — E78011 Heterozygous familial hypercholesterolemia (hefh): Secondary | ICD-10-CM | POA: Insufficient documentation

## 2024-01-06 DIAGNOSIS — M791 Myalgia, unspecified site: Secondary | ICD-10-CM | POA: Diagnosis not present

## 2024-01-06 DIAGNOSIS — R030 Elevated blood-pressure reading, without diagnosis of hypertension: Secondary | ICD-10-CM | POA: Diagnosis not present

## 2024-01-06 DIAGNOSIS — I2510 Atherosclerotic heart disease of native coronary artery without angina pectoris: Secondary | ICD-10-CM | POA: Insufficient documentation

## 2024-01-06 DIAGNOSIS — T466X5D Adverse effect of antihyperlipidemic and antiarteriosclerotic drugs, subsequent encounter: Secondary | ICD-10-CM

## 2024-01-06 DIAGNOSIS — Z7189 Other specified counseling: Secondary | ICD-10-CM

## 2024-01-06 NOTE — Progress Notes (Signed)
 Cardiology Office Note:  .   Date:  01/06/2024  ID:  Jennifer Frye, DOB 1965/06/02, MRN [id number] PCP: Okey Carlin Redbird, MD  [facility] Providers Cardiologist:  Shelda Bruckner, MD {  History of Present Illness: .   Jennifer Frye is a 58 y.o. female with elevated calcium score, statin intolerance, hypercholesterolemia seen as a new patient consultation at the request of Dr. Okey for elevate calcium score, hypercholesterolemia, and statin intolerance.  Referral from 11/15/23 reviewed. Referred by Dr. Okey for elevated calcium score and statin intolerance. Office note from 11/10/23 reviewed under media tab. Noted that she stopped statin due to myalgia. Reported intolerance to atorvastatin with severe joint pain. Lipids from that visit show Tchol 281, TG 151, HDL 52, calc LDL 200. LDL >190 seen on included labs back to 2023. Ca score from 11/02/21 showed score of 7 (74th %ile).  No clear angina. Sometimes feels a grabbing in her chest, brief, nonexertional. Also sometimes has blotchy red spots on her face.   Believes she tried rosuvastatin as well as atorvastatin. Both caused severe myalgia.   FH: sister with hypertension, mother with hypertension, father with hypertension. Mat gpa died of MI at age 58. No other MI history.   Has plantar fasciitis, limits her activity somewhat. Can walk the dog in the neighborhood, climb stairs, etc but otherwise no intentional strenuous activity.   ROS: Denies shortness of breath with normal exertion. No PND, orthopnea, LE edema or unexpected weight gain. No syncope or palpitations. ROS otherwise negative except as noted.   Studies Reviewed: SABRA    EKG:  EKG Interpretation Date/Time:  Friday January 06 2024 15:13:38 EST Ventricular Rate:  87 PR Interval:  130 QRS Duration:  76 QT Interval:  372 QTC Calculation: 447 R Axis:   -5  Text Interpretation: Normal sinus rhythm Nonspecific ST abnormality When compared with ECG of 15-Jul-2009  09:53, No significant change was found Confirmed by Bruckner Shelda [phone]) on 01/06/2024 3:44:24 PM    Physical Exam:   VS:  BP (!) 146/92   Pulse 92   Ht 5' 7 (1.702 m)   Wt 174 lb 6.4 oz (79.1 kg)   LMP 05/16/2012   SpO2 95%   BMI 27.31 kg/m    Wt Readings from Last 3 Encounters:  01/06/24 174 lb 6.4 oz (79.1 kg)  07/26/22 163 lb (73.9 kg)  06/22/21 155 lb (70.3 kg)    GEN: Well nourished, well developed in no acute distress HEENT: Normal, moist mucous membranes NECK: No JVD CARDIAC: regular rhythm, normal S1 and S2, no rubs or gallops. No murmur. VASCULAR: Radial and DP pulses 2+ bilaterally. No carotid bruits RESPIRATORY:  Clear to auscultation without rales, wheezing or rhonchi  ABDOMEN: Soft, non-tender, non-distended MUSCULOSKELETAL:  Ambulates independently SKIN: Warm and dry, no edema NEUROLOGIC:  Alert and oriented x 3. No focal neuro deficits noted. PSYCHIATRIC:  Normal affect    ASSESSMENT AND PLAN: .    Coronary artery calcification, consistent with nonobstructive CAD Statin myalgia Hypercholesterolemia, concerning for heterozygous familial hypercholesterolemia -Ca score 7 in 2023, 74th %ile -did not tolerate rosuvastatin or atorvastatin due to myalgia -reviewed lifestyle recommendations, see below -discussed PCSK9i, she is amenable. We will ask our precert team to see if this needs a prior authorization. If she needs help with first injection, we can set her up with nurse visit  Elevated blood pressure without formal hypertension diagnosis -strong family history of hypertension -on no medications -discussed home blood pressure monitoring,  she will start this and then will follow up  CV risk counseling and prevention -recommend heart healthy/Mediterranean diet, with whole grains, fruits, vegetable, fish, lean meats, nuts, and olive oil. Limit salt. -recommend moderate walking, 3-5 times/week for 30-50 minutes each session. Aim for at least 150  minutes/week. Goal should be pace of 3 miles/hours, or walking 1.5 miles in 30 minutes -recommend avoidance of tobacco products. Avoid excess alcohol.  Dispo: 6-8 weeks to review blood pressure  Signed, Shelda Bruckner, MD   Shelda Bruckner, MD, PhD, [facility] [place]  [facility]  [place]  Heart & Vascular at [facility] at [facility] [address] [place], [place] [address] [phone]

## 2024-01-06 NOTE — Patient Instructions (Addendum)
 how to check blood pressure:  -sit comfortably in a chair, feet uncrossed and flat on floor, for 5-10 minutes  -arm ideally should rest at the level of the heart. However, arm should be relaxed and not tense (for example, do not hold the arm up unsupported)  -avoid exercise, caffeine, and tobacco for at least 30 minutes prior to BP reading  -don't take BP cuff reading over clothes (always place on skin directly)  Medication Instructions:  Dr. Lonni recommends Repatha (PCSK9). This is an injectable cholesterol medication self-administered once every 14 days. This medication will likely need prior approval with your insurance company, which we will work on. If the medication is not approved initially, we may need to do an appeal with your insurance. If approved, we will provide you with copay and cost information. We'll then send the prescription to your pharmacy. We would have you complete another set of fasting labs between 3-4 months to reassess cholesterol.   Repatha is self-injected once every 14 days in subcutaneous or fatty tissue - such as belly or side/outer/upper thigh. It is best stored in the refrigerator but is stable at room temp up to 28 days. Please take the pen-injector out of fridge about 30 minutes - 1 hour prior to injection, to allow it to warm closer to room temperature.   This medication is very effective in lowering LDL and can lower LPa, as Dr. Cherilyn mentioned. It is also generally well tolerated -- most common reaction may be cold-like symptoms such as runny nose, scratchy throat, as this is an antibody therapy. It is generally self-limiting and after a few doses, your body should have normalized to the medication.   Here is a demo video: https://www.schwartz.org/   If you need a co-pay card for Repatha: lawsponsor.fr  Patient Assistance:   These foundations have funds at various times.   The [employer] Foundation:  https://www.panfoundation.org/disease-funds/hypercholesterolemia/ -- can sign up for wait list  The [facility] offers assistance to help pay for medication copays.  They will cover copays for all cholesterol lowering meds, including statins, fibrates, omega-3 fish oils like Vascepa, ezetimibe, Repatha, Praluent, Nexletol, Nexlizet.  The cards are usually good for $2,500 or 12 months, whichever comes first. Our fax # is [phone] (you will need this to apply) Go to healthwellfoundation.org Click on "Apply Now" Answer questions as to whom is applying (patient or representative) Your disease fund will be "hypercholesterolemia - Medicare access" They will ask questions about finances and which medications you are taking for cholesterol When you submit, the approval is usually within minutes.  You will need to print the card information from the site You will need to show this information to your pharmacy, they will bill your Medicare Part D plan first -then bill Health Well --for the copay.   You can also call them at [phone], although the hold times can be quite long.   *If you need a refill on your cardiac medications before your next appointment, please call your pharmacy*  Lab Work: None today.  Testing/Procedures: none  Follow-Up: At [facility], you and your health needs are our priority.  As part of our continuing mission to provide you with exceptional heart care, our providers are all part of one team.  This team includes your primary Cardiologist (physician) and Advanced Practice Providers or APPs (Physician Assistants and Nurse Practitioners) who all work together to provide you with the care you need, when you need it.  Your next appointment:  6-8 week(s)  Provider:   Shelda Bruckner, MD, Rosaline Bane, NP, or Reche Finder, NP

## 2024-01-09 ENCOUNTER — Other Ambulatory Visit (HOSPITAL_COMMUNITY): Payer: Self-pay

## 2024-01-09 ENCOUNTER — Telehealth: Payer: Self-pay

## 2024-01-09 MED ORDER — REPATHA SURECLICK 140 MG/ML ~~LOC~~ SOAJ: 140.0000 mg | SUBCUTANEOUS | 6 refills | Status: DC

## 2024-01-09 NOTE — Telephone Encounter (Signed)
 Pharmacy Patient Advocate Encounter  Insurance verification completed.   The patient is insured through [employer] test claim for REPATHA . Currently a quantity of 2 is a 28 day supply and the co-pay is $40 . The current 28 day co-pay is, $40.  No PA needed at this time.  This test claim was processed through [facility]- copay amounts may vary at other pharmacies due to pharmacy/plan contracts, or as the patient moves through the different stages of their insurance plan.

## 2024-01-09 NOTE — Addendum Note (Signed)
 Addended by: Krishna Dancel on: 01/09/2024 11:26 AM   Modules accepted: Orders

## 2024-01-09 NOTE — Telephone Encounter (Signed)
-----   Message from Nurse Addie ORN sent at 01/06/2024  4:16 PM EST ----- Regarding: Repatha  PA Hi! Repatha  will be ordered once PA information is received.  Thank you! Addie, RN

## 2024-01-15 ENCOUNTER — Encounter (HOSPITAL_BASED_OUTPATIENT_CLINIC_OR_DEPARTMENT_OTHER): Payer: Self-pay | Admitting: Cardiology

## 2024-02-12 ENCOUNTER — Encounter (HOSPITAL_BASED_OUTPATIENT_CLINIC_OR_DEPARTMENT_OTHER): Payer: Self-pay | Admitting: Cardiology

## 2024-02-21 ENCOUNTER — Ambulatory Visit (HOSPITAL_BASED_OUTPATIENT_CLINIC_OR_DEPARTMENT_OTHER): Admitting: Cardiology

## 2024-03-07 ENCOUNTER — Ambulatory Visit (HOSPITAL_BASED_OUTPATIENT_CLINIC_OR_DEPARTMENT_OTHER): Admitting: Cardiology

## 2024-03-07 ENCOUNTER — Encounter (HOSPITAL_BASED_OUTPATIENT_CLINIC_OR_DEPARTMENT_OTHER): Payer: Self-pay | Admitting: Cardiology

## 2024-03-07 ENCOUNTER — Encounter (HOSPITAL_BASED_OUTPATIENT_CLINIC_OR_DEPARTMENT_OTHER): Payer: Self-pay

## 2024-03-07 VITALS — BP 118/93 | HR 82 | Ht 67.0 in | Wt 169.9 lb

## 2024-03-07 DIAGNOSIS — M791 Myalgia, unspecified site: Secondary | ICD-10-CM | POA: Diagnosis not present

## 2024-03-07 DIAGNOSIS — I2510 Atherosclerotic heart disease of native coronary artery without angina pectoris: Secondary | ICD-10-CM

## 2024-03-07 DIAGNOSIS — T466X5D Adverse effect of antihyperlipidemic and antiarteriosclerotic drugs, subsequent encounter: Secondary | ICD-10-CM | POA: Diagnosis not present

## 2024-03-07 DIAGNOSIS — I10 Essential (primary) hypertension: Secondary | ICD-10-CM

## 2024-03-07 DIAGNOSIS — E78011 Heterozygous familial hypercholesterolemia (hefh): Secondary | ICD-10-CM | POA: Diagnosis not present

## 2024-03-07 DIAGNOSIS — Z7189 Other specified counseling: Secondary | ICD-10-CM

## 2024-03-07 DIAGNOSIS — T466X5A Adverse effect of antihyperlipidemic and antiarteriosclerotic drugs, initial encounter: Secondary | ICD-10-CM

## 2024-03-07 NOTE — Patient Instructions (Signed)
 Medication Instructions:  No changes *If you need a refill on your cardiac medications before your next appointment, please call your pharmacy*  Lab Work: none If you have labs (blood work) drawn today and your tests are completely normal, you will receive your results only by: MyChart Message (if you have MyChart) OR A paper copy in the mail If you have any lab test that is abnormal or we need to change your treatment, we will call you to review the results.  Testing/Procedures: none  Follow-Up: At [facility], you and your health needs are our priority.  As part of our continuing mission to provide you with exceptional heart care, our providers are all part of one team.  This team includes your primary Cardiologist (physician) and Advanced Practice Providers or APPs (Physician Assistants and Nurse Practitioners) who all work together to provide you with the care you need, when you need it.  Your next appointment:   12 month(s)  Provider:   Shelda Bruckner, MD, Rosaline Bane, NP, or Reche Finder, NP
